# Patient Record
Sex: Female | Born: 1985 | Race: Black or African American | Hispanic: No | Marital: Single | Smoking: Former smoker
Health system: Southern US, Community
[De-identification: ages and names within clinical notes are randomized; demographics above are authoritative.]

## PROBLEM LIST (undated history)

## (undated) DIAGNOSIS — D649 Anemia, unspecified: Secondary | ICD-10-CM

## (undated) DIAGNOSIS — M5430 Sciatica, unspecified side: Secondary | ICD-10-CM

## (undated) HISTORY — PX: APPENDECTOMY: SHX54

## (undated) HISTORY — DX: Morbid (severe) obesity due to excess calories: E66.01

## (undated) HISTORY — DX: Anemia, unspecified: D64.9

---
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
## 2005-11-06 ENCOUNTER — Emergency Department: Payer: Self-pay

## 2005-11-18 ENCOUNTER — Emergency Department: Payer: Self-pay | Admitting: Emergency Medicine

## 2009-05-31 ENCOUNTER — Observation Stay: Payer: Self-pay

## 2009-07-01 ENCOUNTER — Observation Stay: Payer: Self-pay

## 2009-07-02 ENCOUNTER — Ambulatory Visit: Payer: Self-pay | Admitting: Unknown Physician Specialty

## 2009-08-08 ENCOUNTER — Observation Stay: Payer: Self-pay

## 2009-08-11 ENCOUNTER — Inpatient Hospital Stay: Payer: Self-pay

## 2010-07-11 ENCOUNTER — Emergency Department: Payer: Self-pay | Admitting: Emergency Medicine

## 2010-11-04 ENCOUNTER — Emergency Department: Payer: Self-pay | Admitting: Emergency Medicine

## 2011-10-03 ENCOUNTER — Emergency Department: Payer: Self-pay | Admitting: Emergency Medicine

## 2011-10-03 LAB — URINALYSIS, COMPLETE
Bilirubin,UR: NEGATIVE
Glucose,UR: NEGATIVE mg/dL (ref 0–75)
Ketone: NEGATIVE
Nitrite: NEGATIVE
Ph: 6 (ref 4.5–8.0)
Protein: 30
RBC,UR: 38 /HPF (ref 0–5)
Squamous Epithelial: 14
WBC UR: 28 /HPF (ref 0–5)

## 2011-10-03 LAB — HCG, QUANTITATIVE, PREGNANCY: Beta Hcg, Quant.: 22170 m[IU]/mL — ABNORMAL HIGH

## 2011-10-05 ENCOUNTER — Emergency Department: Payer: Self-pay | Admitting: Internal Medicine

## 2011-10-05 LAB — HCG, QUANTITATIVE, PREGNANCY: Beta Hcg, Quant.: 34142 m[IU]/mL — ABNORMAL HIGH

## 2012-02-14 ENCOUNTER — Inpatient Hospital Stay: Payer: Self-pay | Admitting: Surgery

## 2012-02-14 LAB — COMPREHENSIVE METABOLIC PANEL
Alkaline Phosphatase: 91 U/L (ref 50–136)
Anion Gap: 9 (ref 7–16)
BUN: 11 mg/dL (ref 7–18)
Bilirubin,Total: 0.7 mg/dL (ref 0.2–1.0)
Chloride: 104 mmol/L (ref 98–107)
Co2: 23 mmol/L (ref 21–32)
Creatinine: 1.15 mg/dL (ref 0.60–1.30)
EGFR (African American): >60
EGFR (Non-African Amer.): >60
Glucose: 99 mg/dL (ref 65–99)
Potassium: 3.9 mmol/L (ref 3.5–5.1)
SGOT(AST): 12 U/L — ABNORMAL LOW (ref 15–37)

## 2012-02-14 LAB — URINALYSIS, COMPLETE
Glucose,UR: NEGATIVE mg/dL (ref 0–75)
Glucose,UR: 50 mg/dL (ref 0–75)
Leukocyte Esterase: NEGATIVE
Nitrite: NEGATIVE
Ph: 6 (ref 4.5–8.0)
Ph: 5 (ref 4.5–8.0)
Protein: 100
Specific Gravity: 1.026 (ref 1.003–1.030)
Squamous Epithelial: 4
Squamous Epithelial: NONE SEEN
WBC UR: 492 /HPF (ref 0–5)
WBC UR: 4 /HPF (ref 0–5)

## 2012-02-14 LAB — CBC
HCT: 38.1 % (ref 35.0–47.0)
HGB: 12.1 g/dL (ref 12.0–16.0)
MCH: 24.5 pg — ABNORMAL LOW (ref 26.0–34.0)
MCV: 77 fL — ABNORMAL LOW (ref 80–100)
RBC: 4.95 10*6/uL (ref 3.80–5.20)
RDW: 16.8 % — ABNORMAL HIGH (ref 11.5–14.5)
WBC: 22.8 10*3/uL — ABNORMAL HIGH (ref 3.6–11.0)

## 2012-02-14 LAB — CLOSTRIDIUM DIFFICILE BY PCR

## 2012-02-15 LAB — COMPREHENSIVE METABOLIC PANEL
Alkaline Phosphatase: 92 U/L (ref 50–136)
Anion Gap: 11 (ref 7–16)
Bilirubin,Total: 0.4 mg/dL (ref 0.2–1.0)
Calcium, Total: 7.8 mg/dL — ABNORMAL LOW (ref 8.5–10.1)
Chloride: 105 mmol/L (ref 98–107)
EGFR (African American): 59 — ABNORMAL LOW
Glucose: 125 mg/dL — ABNORMAL HIGH (ref 65–99)
Osmolality: 277 (ref 275–301)
Potassium: 4 mmol/L (ref 3.5–5.1)
SGOT(AST): 16 U/L (ref 15–37)
SGPT (ALT): 17 U/L (ref 12–78)
Sodium: 137 mmol/L (ref 136–145)

## 2012-02-15 LAB — CBC WITH DIFFERENTIAL/PLATELET
Basophil %: 0.1 %
Eosinophil %: 0 %
HCT: 36.2 % (ref 35.0–47.0)
Lymphocyte #: 0.5 10*3/uL — ABNORMAL LOW (ref 1.0–3.6)
Lymphocyte %: 1.7 %
MCV: 78 fL — ABNORMAL LOW (ref 80–100)
Monocyte #: 0.6 x10 3/mm (ref 0.2–0.9)
Neutrophil %: 96.4 %
Platelet: 255 10*3/uL (ref 150–440)

## 2012-02-16 LAB — URINE CULTURE

## 2012-02-16 LAB — CBC WITH DIFFERENTIAL/PLATELET
Basophil #: 0 10*3/uL (ref 0.0–0.1)
HGB: 10.3 g/dL — ABNORMAL LOW (ref 12.0–16.0)
Lymphocyte %: 8.8 %
MCH: 24.4 pg — ABNORMAL LOW (ref 26.0–34.0)
Monocyte %: 5.3 %
RDW: 17 % — ABNORMAL HIGH (ref 11.5–14.5)
WBC: 17 10*3/uL — ABNORMAL HIGH (ref 3.6–11.0)

## 2012-02-16 LAB — BASIC METABOLIC PANEL WITH GFR
Anion Gap: 7
BUN: 13 mg/dL
Calcium, Total: 8.3 mg/dL — ABNORMAL LOW
Chloride: 109 mmol/L — ABNORMAL HIGH
Co2: 24 mmol/L
Creatinine: 1.12 mg/dL
EGFR (African American): >60
EGFR (Non-African Amer.): >60
Glucose: 99 mg/dL
Osmolality: 280
Potassium: 4 mmol/L
Sodium: 140 mmol/L

## 2012-02-16 LAB — PATHOLOGY REPORT

## 2012-02-16 LAB — STOOL CULTURE

## 2012-02-17 LAB — CBC WITH DIFFERENTIAL/PLATELET
Basophil #: 0.1 10*3/uL (ref 0.0–0.1)
Eosinophil #: 0.2 10*3/uL (ref 0.0–0.7)
Lymphocyte #: 1.8 10*3/uL (ref 1.0–3.6)
Lymphocyte %: 15.1 %
MCH: 25.5 pg — ABNORMAL LOW (ref 26.0–34.0)
MCV: 77 fL — ABNORMAL LOW (ref 80–100)
Monocyte #: 0.9 x10 3/mm (ref 0.2–0.9)
Monocyte %: 7.4 %
Neutrophil #: 9.2 10*3/uL — ABNORMAL HIGH (ref 1.4–6.5)
Platelet: 240 10*3/uL (ref 150–440)
RBC: 4.1 10*6/uL (ref 3.80–5.20)
RDW: 16.9 % — ABNORMAL HIGH (ref 11.5–14.5)

## 2012-02-18 LAB — URINALYSIS, COMPLETE
Bacteria: NONE SEEN
Bilirubin,UR: NEGATIVE
Blood: NEGATIVE
Glucose,UR: NEGATIVE mg/dL (ref 0–75)
Ketone: NEGATIVE
Leukocyte Esterase: NEGATIVE
Ph: 6 (ref 4.5–8.0)
Squamous Epithelial: 5
WBC UR: 7 /HPF (ref 0–5)

## 2012-02-19 LAB — CULTURE, BLOOD (SINGLE)

## 2012-02-20 LAB — URINE CULTURE

## 2012-02-21 LAB — CREATININE, SERUM
EGFR (African American): >60
EGFR (Non-African Amer.): >60

## 2012-08-19 ENCOUNTER — Emergency Department: Payer: Self-pay | Admitting: Emergency Medicine

## 2012-10-21 ENCOUNTER — Emergency Department: Payer: Self-pay | Admitting: Emergency Medicine

## 2013-07-13 ENCOUNTER — Emergency Department: Payer: Self-pay | Admitting: Emergency Medicine

## 2014-03-10 DIAGNOSIS — Z720 Tobacco use: Secondary | ICD-10-CM | POA: Insufficient documentation

## 2014-03-10 DIAGNOSIS — Z6841 Body Mass Index (BMI) 40.0 and over, adult: Secondary | ICD-10-CM | POA: Insufficient documentation

## 2014-05-29 NOTE — Discharge Summary (Signed)
PATIENT NAME:  Ashley Olson, Ashley Olson MR#:  [id number] DATE OF BIRTH:  07-24-1985  DATE OF ADMISSION:  02/14/2012 DATE OF DISCHARGE:  02/22/2012  PRINCIPLE DIAGNOSIS: Generalized peritonitis likely secondary to pelvic inflammatory disease.   OTHER DIAGNOSIS: Morbid obesity.   PRINCIPLE PROCEDURE PERFORMED DURING THIS ADMISSION: 02/14/2012 exploratory laparotomy with drainage and washout of generalized peritonitis, incidental appendectomy.   HOSPITAL COURSE: Ms. Ashley Olson was admitted to the hospital, given broad-spectrum antibiotics, and underwent the above-mentioned procedure for the above-mentioned diagnosis. Postoperatively, she had doxycycline added and she had slowly progressive improvement until the day of discharge, on January 16th, where she was tolerating a regular diet, was ambulatory and was discharged home on oral morphine and Pepcid and a regular diet. She was asked to make an appointment to see me in 1 week for followup and staple removal and to call in the interim for any problems.  ____________________________ Claude MangesWilliam F. Sebastin Perlmutter, MD wfm:sb Olson: 03/06/2012 20:55:34 ET T: 03/07/2012 08:56:19 ET JOB#: [id number]  cc: Claude MangesWilliam F. Tamana Hatfield, MD, <Dictator> Claude MangesWILLIAM F Jheremy Boger MD ELECTRONICALLY SIGNED 03/07/2012 19:10

## 2014-05-29 NOTE — H&P (Signed)
History of Present Illness 29 yo [place] bf with 2 - 3 days of abdominal pain that began periumbilical and spread out bilaterally. Pain is not continuous; it is intermittent. She had some nausea yesterday and has been anorexic for 2 - 3 days; no vomiting. She was feverish yesterday but didn't take her temp. When the pain began she experienced severe - almost watery - frequent diarrhea. Prior to her recent illness she denies eating fish or chicken bones or anything with bay leaves in it.    Past History MO   ALLERGIES:  Amoxicillin: Swelling  Family and Social History:   Family History Non-Contributory    Social History negative tobacco, positive ETOH, single, two kids, works in [employer] [place]   Review of Systems:   Fever/Chills Yes    Cough No    Sputum No    Abdominal Pain Yes    Diarrhea Yes    Constipation No    Nausea/Vomiting Yes    SOB/DOE No    Chest Pain No    Dysuria No    Tolerating PT No  abdomen hurts when she walks    Tolerating Diet No  Nauseated    Medications/Allergies Reviewed Medications/Allergies reviewed   Physical Exam:   GEN well developed, well nourished, obese    HEENT pink conjunctivae, PERRL, hearing intact to voice, dry oral mucosa, Oropharynx clear    NECK supple  thyroid not tender    RESP normal resp effort  clear BS  no use of accessory muscles    CARD regular rate  no murmur  no JVD  no Rub    ABD positive tenderness  very obese, severe equisite diffuse tenderness with voluntary AND involutary guarding    LYMPH negative neck    EXTR negative cyanosis/clubbing, negative edema    SKIN skin turgor good    NEURO cranial nerves intact, follows commands, motor/sensory function intact    PSYCH alert, A+O to time, place, person   Lab Results:  Hepatic:  08-Jan-14 12:52    Bilirubin, Total 0.7   Alkaline Phosphatase 91   SGPT (ALT) 17   SGOT (AST)  12   Total Protein, Serum  8.5   Albumin, Serum   3.2  Routine Micro:  08-Jan-14 14:01    Micro Text Report WET PREP   COMMENT                   NO WHITE BLOOD CELLS SEEN   COMMENT                   NO TRICHOMONAS,SPERMATOZOA,YEAST,OR CLUE CELLS SEEN   ANTIBIOTIC                        Comment 1. NO WHITE BLOOD CELLS SEEN   Comment 2. NO TRICHOMONAS,SPERMATOZOA,YEAST,OR CLUE CELLS SEEN  Result(s) reported on 14 Feb 2012 at 02:21PM.    15:25    Micro Text Report CLOS.DIFF ASSAY, RT-PCR   COMMENT                   NEGATIVE-CLOS.DIFFICILE TOXIN NOT DETECTED BY PCR   ANTIBIOTIC                        Clostridium Diff Toxin by RT-PCR NEGATIVE-CLOS.DIFFICILE TOXIN NOT DETECTED BY PCR ---------------------------------- Test procedure integrates sample purification, nucleic acid amplification, and detection of the target Clostridium  difficile sequence in simple or complex samplesusing real-time PCR and RT-PCR assays.  Routine Chem:  08-Jan-14 12:52    Glucose, Serum 99   BUN 11   Creatinine (comp) 1.15   Sodium, Serum 136   Potassium, Serum 3.9   Chloride, Serum 104   CO2, Serum 23   Calcium (Total), Serum 9.2   Osmolality (calc) 271   eGFR (African American) >60   eGFR (Non-African American) >60 (eGFR values <74m/min/1.73 m2 may be an indication of chronic kidney disease (CKD). Calculated eGFR is useful in patients with stable renal function. The eGFR calculation will not be reliable in acutely ill patients when serum creatinine is changing rapidly. It is not useful in  patients on dialysis. The eGFR calculation may not be applicable to patients at the low and high extremes of body sizes, pregnant women, and vegetarians.)   Anion Gap 9   Lipase 117 (Result(s) reported on 14 Feb 2012 at 01:30PM.)  Routine UA:  08-Jan-14 12:42    Color (UA) Amber   Clarity (UA) Turbid   Glucose (UA) Negative   Bilirubin (UA) Negative   Ketones (UA) 1+   Specific Gravity (UA) 1.026   Blood (UA) 3+   pH (UA) 6.0   Protein (UA) 100 mg/dL    Nitrite (UA) Negative   Leukocyte Esterase (UA) 3+ (Result(s) reported on 14 Feb 2012 at 01:32PM.)   RBC (UA) 283 /HPF   WBC (UA) 492 /HPF   Bacteria (UA) 2+   Epithelial Cells (UA) 4 /HPF   WBC Clump (UA) PRESENT   Mucous (UA) PRESENT (Result(s) reported on 14 Feb 2012 at 01:32PM.)  Routine Hem:  08-Jan-14 12:52    WBC (CBC)  22.8   RBC (CBC) 4.95   Hemoglobin (CBC) 12.1   Hematocrit (CBC) 38.1   Platelet Count (CBC) 285 (Result(s) reported on 14 Feb 2012 at 01:31PM.)   MCV  77   MCH  24.5   MCHC  31.8   RDW  16.8     Assessment/Admission Diagnosis Perforated hepatic flexure of unknown etiology Severe UTI    Plan Resuscitate w/ IVF IV ABx Exploratory Laparotomy   Electronic Signatures: MConsuela Mimes(MD)  (Signed 08-Jan-14 21:01)  Authored: CHIEF COMPLAINT and HISTORY, PAST MEDICAL/SURGIAL HISTORY, ALLERGIES, HOME MEDICATIONS, FAMILY AND SOCIAL HISTORY, REVIEW OF SYSTEMS, PHYSICAL EXAM, LABS, ASSESSMENT AND PLAN   Last Updated: 08-Jan-14 21:01 by MConsuela Mimes(MD)

## 2014-05-29 NOTE — Op Note (Signed)
PATIENT NAME:  Ashley Olson, Ashley D MR#:  [id number] DATE OF BIRTH:  [birth year]  DATE OF PROCEDURE:  02/14/2012  PROCEDURES PERFORMED:  1.  Exploratory laparotomy.  2.  Incidental appendectomy.   PREOPERATIVE DIAGNOSIS: Pneumoperitoneum secondary to hepatic flexure colon perforation.   POSTOPERATIVE DIAGNOSIS: Generalized peritonitis likely secondary to pelvic inflammatory disease; no bowel lesions appreciated.   SURGEON: Claude MangesWilliam F Anaaya Fuster, M.D.   ANESTHESIA: General.   PROCEDURE IN DETAIL: The patient was placed supine on the operating room table and prepped and draped in the usual sterile fashion. A midline incision was made in the upper abdomen and this was eventually extended slightly below the umbilicus and the subcutaneous tissue and linea alba were opened with the electrocautery and the peritoneum was entered carefully. There was generalized peritonitis with pus in all 4 quadrants of the abdomen. Careful inspection of the hepatic flexure area of the colon near the gallbladder where the supposed area of the perforation had occurred, showed the colon wall to be completely intact. Furthermore, there was no evidence of duodenal ulcer, perforation and the gallbladder appeared completely normal. Further exploration failed to reveal any colon perforations into either the ascending colon, the transverse colon, the descending colon or the sigmoid colon. After the incision was extended distally, I could see that both fallopian tubes were injected and red, erythematous and edematous, consistent with pelvic inflammatory disease. 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 Both ovaries appeared relatively normal with the exception of surrounding exudative inflammation. The appendix itself was quite large and there was exudate along the serosal surface of the appendix, but I did not believe that the patient had appendicitis as the etiology for her generalized peritonitis.   Nevertheless, I performed an  incidental appendectomy by taking down the mesoappendix with the Harmonic scalpel and amputating the appendix within a GIA stapling device just at its base and junction with the cecum. I then washed out the entire abdomen with copious amounts of warm normal saline and removed as much exudative process from the serosal surface of various small intestinal loops as I could. The entire small intestine was run from one end to the other and this was entirely normal. The rectum and rectosigmoid junction were better visualized after the incision had been extended inferiorly and this also was normal. Although the cecum was dilated as was the transverse colon, they were also normal. I draped the omentum over top of the small intestine, which was replaced in its anatomic position and closed the linea alba with running #1 PDS suture and placed 2 interrupted internal simple retention sutures of #2 Prolene. I irrigated the subcutaneous tissue and closed the skin with a skin stapling device and added a sterile dressing to complete the procedure. The patient tolerated the procedure well. There were no complications.   ____________________________ Claude MangesWilliam F. Lawarence Meek, MD wfm:aw D: 02/14/2012 23:59:06 ET T: 02/15/2012 08:38:51 ET JOB#: [id number]  cc: Claude MangesWilliam F. Kahlia Lagunes, MD, <Dictator> Claude MangesWILLIAM F Ersilia Brawley MD ELECTRONICALLY SIGNED 02/15/2012 19:42

## 2014-07-07 IMAGING — CR DG LUMBAR SPINE 2-3V
1 series · 3 of 3 positions shown · non-contrast
Comparison: CT abdomen 02/14/2012.

CLINICAL DATA: No injury.  Low back pain radiating down right leg.

EXAM:
LUMBAR SPINE - 2-3 VIEW

[Series 1: t lumbar spine ap · 0.14mm/px · 3 of 3 slices shown]
[im 1/3]
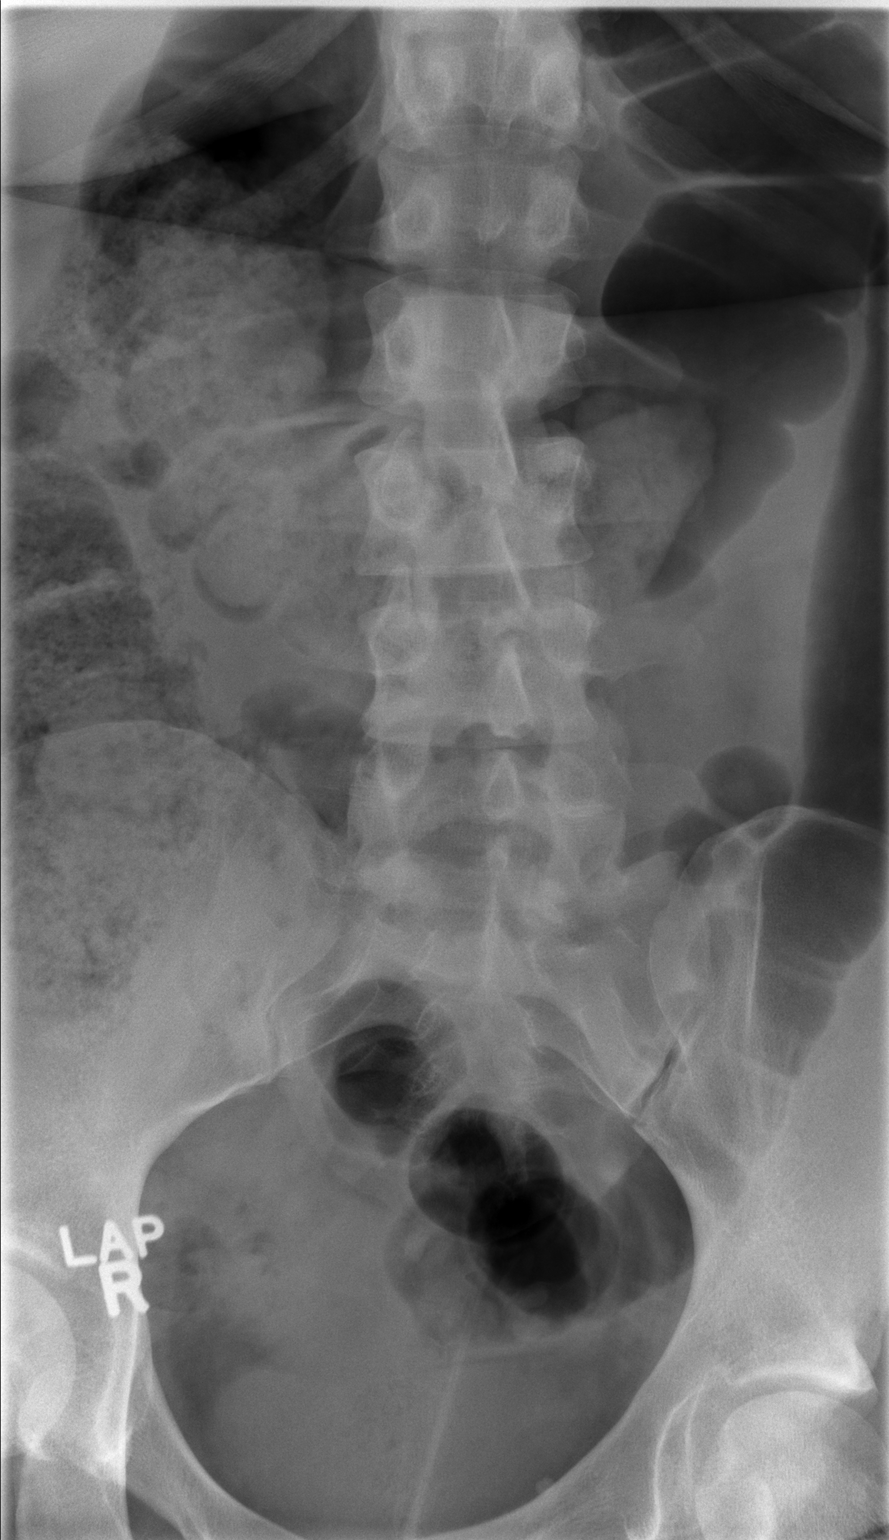
[im 2/3]
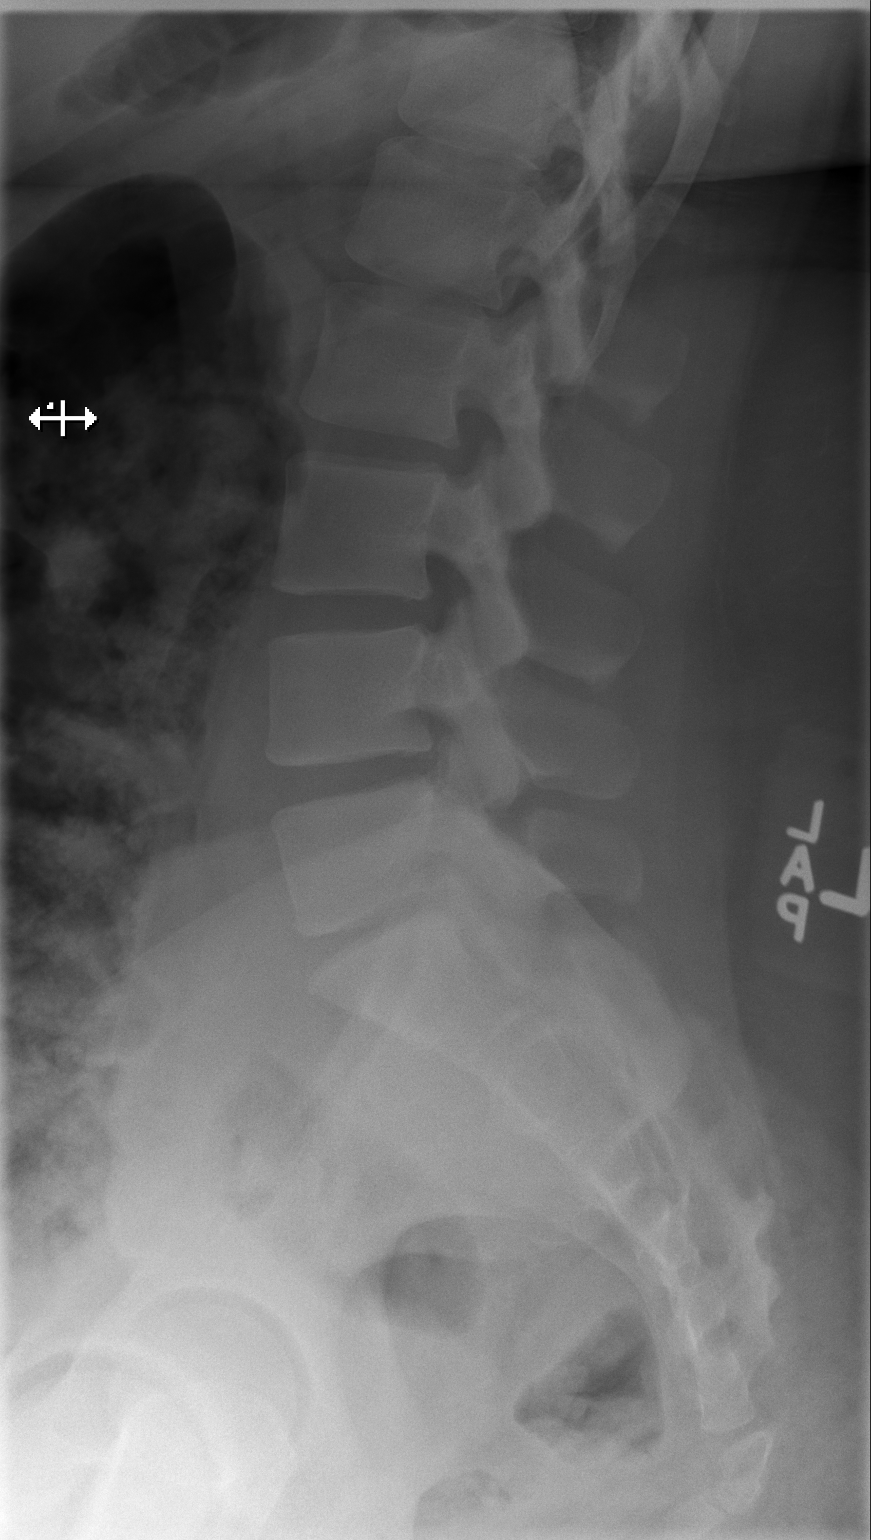
[im 3/3]
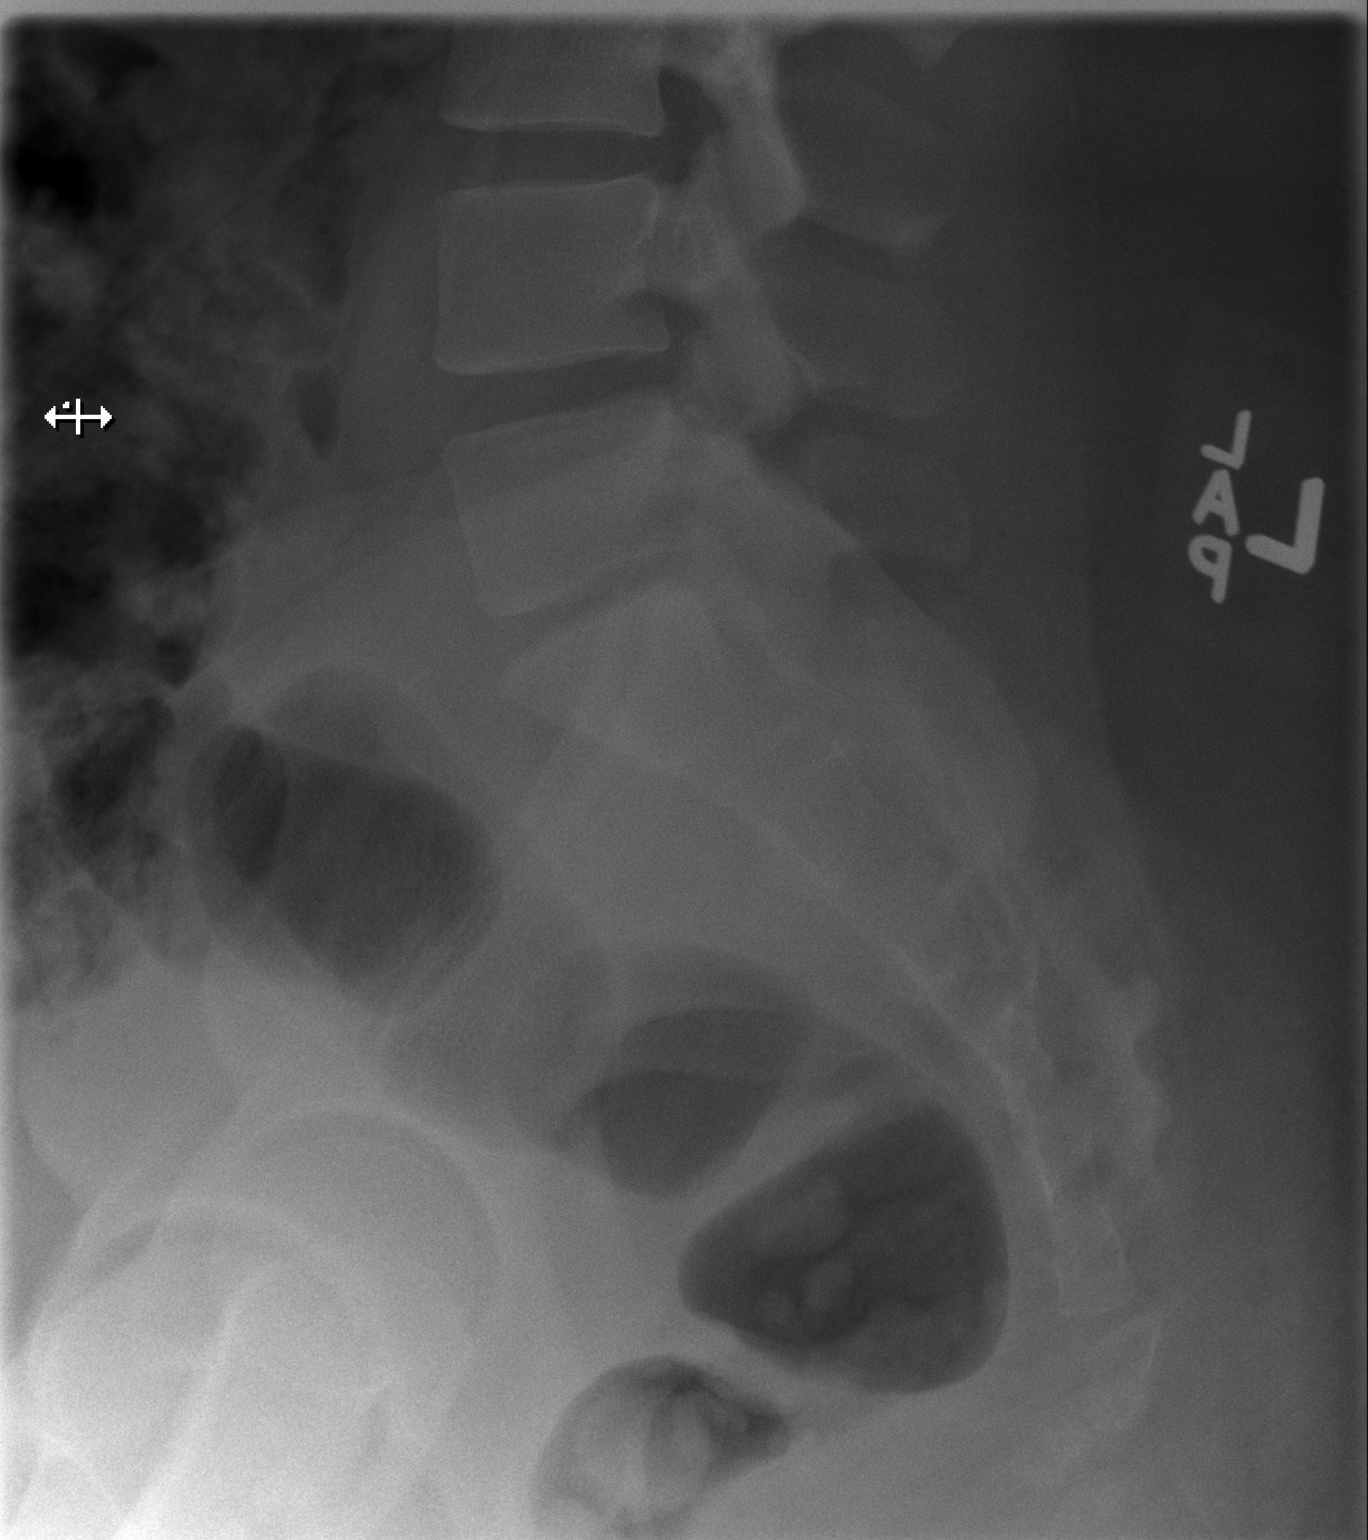

[3 of 3 positions shown; findings below may reference images not displayed]

FINDINGS: Vertebral body alignment and heights are within normal. There is
minimal disc space narrowing at the L4-5 and L5-S1 levels. There is
no compression fracture or subluxation. Mild fecal retention over
the right colon and transverse colon.
IMPRESSION: Evidence of early degenerative disc disease at the L4-5 and L5-S1
levels.

## 2014-09-08 ENCOUNTER — Emergency Department
Admission: EM | Admit: 2014-09-08 | Discharge: 2014-09-08 | Disposition: A | Discharge status: Home / Self Care | Payer: Managed Care, Other (non HMO) | Attending: Emergency Medicine | Admitting: Emergency Medicine

## 2014-09-08 ENCOUNTER — Encounter: Payer: Self-pay | Admitting: Emergency Medicine

## 2014-09-08 DIAGNOSIS — S39012A Strain of muscle, fascia and tendon of lower back, initial encounter: Secondary | ICD-10-CM | POA: Diagnosis not present

## 2014-09-08 DIAGNOSIS — Z880 Allergy status to penicillin: Secondary | ICD-10-CM | POA: Diagnosis not present

## 2014-09-08 DIAGNOSIS — S3992XA Unspecified injury of lower back, initial encounter: Secondary | ICD-10-CM | POA: Diagnosis present

## 2014-09-08 DIAGNOSIS — Z720 Tobacco use: Secondary | ICD-10-CM | POA: Insufficient documentation

## 2014-09-08 DIAGNOSIS — Y9289 Other specified places as the place of occurrence of the external cause: Secondary | ICD-10-CM | POA: Insufficient documentation

## 2014-09-08 DIAGNOSIS — Y990 Civilian activity done for income or pay: Secondary | ICD-10-CM | POA: Insufficient documentation

## 2014-09-08 DIAGNOSIS — Y9389 Activity, other specified: Secondary | ICD-10-CM | POA: Diagnosis not present

## 2014-09-08 DIAGNOSIS — X58XXXA Exposure to other specified factors, initial encounter: Secondary | ICD-10-CM | POA: Insufficient documentation

## 2014-09-08 MED ORDER — KETOROLAC TROMETHAMINE 10 MG PO TABS: 10.0000 mg | ORAL_TABLET | Freq: Four times a day (QID) | ORAL | Status: DC | PRN

## 2014-09-08 MED ORDER — KETOROLAC TROMETHAMINE 60 MG/2ML IM SOLN
60.0000 mg | Freq: Once | INTRAMUSCULAR | Status: AC
  Administered 2014-09-08: 60 mg via INTRAMUSCULAR
  Filled 2014-09-08: qty 2

## 2014-09-08 MED ORDER — CYCLOBENZAPRINE HCL 10 MG PO TABS: 10.0000 mg | ORAL_TABLET | Freq: Three times a day (TID) | ORAL | Status: DC | PRN

## 2014-09-08 MED ORDER — HYDROCODONE-ACETAMINOPHEN 5-325 MG PO TABS
1.0000 | ORAL_TABLET | Freq: Once | ORAL | Status: AC
  Administered 2014-09-08: 1 via ORAL
  Filled 2014-09-08: qty 1

## 2014-09-08 NOTE — ED Provider Notes (Signed)
[facility] Emergency Department Provider Note ____________________________________________  Time seen: Approximately 8:26 PM  I have reviewed the triage vital signs and the nursing notes.   HISTORY  Chief Complaint Back Pain   HPI Ashley Olson is a 29 y.o. female who presents to the emergency department for evaluation of left lumbar pain. She denies injury, but may have strained her back yesterday while at work while lifting some boxes. She has not taken anything at home for pain. She has had sciatic pain on the left side but this is different.  History reviewed. No pertinent past medical history.  There are no active problems to display for this patient.   Past Surgical History  Procedure Laterality Date  . Appendectomy      Current Outpatient Rx  Name  Route  Sig  Dispense  Refill  . cyclobenzaprine (FLEXERIL) 10 MG tablet   Oral   Take 1 tablet (10 mg total) by mouth 3 (three) times daily as needed for muscle spasms.   30 tablet   0   . ketorolac (TORADOL) 10 MG tablet   Oral   Take 1 tablet (10 mg total) by mouth every 6 (six) hours as needed.   20 tablet   0     Allergies Amoxicillin  No family history on file.  Social History History  Substance Use Topics  . Smoking status: Current Some Day Smoker  . Smokeless tobacco: Not on file  . Alcohol Use: No    Review of Systems Constitutional: No recent illness. Eyes: No visual changes. ENT: No sore throat. Cardiovascular: Denies chest pain or palpitations. Respiratory: Denies shortness of breath. Gastrointestinal: No abdominal pain.  Genitourinary: Negative for dysuria. Musculoskeletal: Pain in left lower back. Skin: Negative for rash. Neurological: Negative for headaches, focal weakness or numbness. 10-point ROS otherwise negative.  ____________________________________________   PHYSICAL EXAM:  VITAL SIGNS: ED Triage Vitals  Enc Vitals Group     BP 09/08/14 1926  161/100 mmHg     Pulse Rate 09/08/14 1926 60     Resp 09/08/14 1926 20     Temp 09/08/14 1926 97.6 F (36.4 C)     Temp Source 09/08/14 1926 Oral     SpO2 09/08/14 1926 99 %     Weight 09/08/14 1926 385 lb (174.635 kg)     Height 09/08/14 1926 6' (1.829 m)     Head Cir --      Peak Flow --      Pain Score 09/08/14 1925 10     Pain Loc --      Pain Edu? --      Excl. in GC? --     Constitutional: Alert and oriented. Well appearing and in no acute distress. Eyes: Conjunctivae are normal. EOMI. Head: Atraumatic. Nose: No congestion/rhinnorhea. Neck: No stridor.  Respiratory: Normal respiratory effort.   Musculoskeletal: Left lower lumbar spine paraspinal muscles tenderness to palpation without focal tenderness in the midline. Neurologic:  Normal speech and language. No gross focal neurologic deficits are appreciated. Speech is normal. No gait instability. Skin:  Skin is warm, dry and intact. Atraumatic. Psychiatric: Mood and affect are normal. Speech and behavior are normal.  ____________________________________________   LABS (all labs ordered are listed, but only abnormal results are displayed)  Labs Reviewed - No data to display ____________________________________________  RADIOLOGY  Not indicated ____________________________________________   PROCEDURES  Procedure(s) performed: None   ____________________________________________   INITIAL IMPRESSION / ASSESSMENT AND PLAN / ED COURSE  Pertinent labs & imaging results that were available during my care of the patient were reviewed by me and considered in my medical decision making (see chart for details).  Patient was advised to follow-up with orthopedics for symptoms that are not improving over the next few days. She was advised to take the anti-inflammatory as prescribed. She was advised to take the muscle relaxer as needed as prescribed. She was advised to return to the emergency department for symptoms that  change or worsen if she is unable schedule an appointment with orthopedics. ____________________________________________   FINAL CLINICAL IMPRESSION(S) / ED DIAGNOSES  Final diagnoses:  Back strain, initial encounter      Chinita Pester, FNP 09/08/14 2201  Minna Antis, MD 09/08/14 2229

## 2014-09-08 NOTE — ED Notes (Signed)
Patient ambulatory to triage with steady gait, without difficulty or distress noted; pt reports burning  lower back pain, hx of same, but increased after lifting boxes yesterday

## 2014-09-08 NOTE — Discharge Instructions (Signed)
Follow up with the orthopedic doctor for symptoms that are not improving over the next few days. Return to the emergency department for symptoms that change or worsen if unable to schedule an appointment.

## 2014-11-04 ENCOUNTER — Emergency Department
Admission: EM | Admit: 2014-11-04 | Discharge: 2014-11-04 | Disposition: A | Discharge status: Home / Self Care | Payer: Managed Care, Other (non HMO) | Attending: Emergency Medicine | Admitting: Emergency Medicine

## 2014-11-04 ENCOUNTER — Emergency Department: Payer: Managed Care, Other (non HMO)

## 2014-11-04 DIAGNOSIS — R202 Paresthesia of skin: Secondary | ICD-10-CM | POA: Insufficient documentation

## 2014-11-04 DIAGNOSIS — Z880 Allergy status to penicillin: Secondary | ICD-10-CM | POA: Insufficient documentation

## 2014-11-04 DIAGNOSIS — R200 Anesthesia of skin: Secondary | ICD-10-CM | POA: Diagnosis present

## 2014-11-04 DIAGNOSIS — M25511 Pain in right shoulder: Secondary | ICD-10-CM | POA: Diagnosis not present

## 2014-11-04 DIAGNOSIS — Z720 Tobacco use: Secondary | ICD-10-CM | POA: Insufficient documentation

## 2014-11-04 DIAGNOSIS — R091 Pleurisy: Secondary | ICD-10-CM | POA: Diagnosis not present

## 2014-11-04 LAB — BASIC METABOLIC PANEL
Anion gap: 7 (ref 5–15)
BUN: 15 mg/dL (ref 6–20)
CALCIUM: 9 mg/dL (ref 8.9–10.3)
CO2: 26 mmol/L (ref 22–32)
CREATININE: 0.89 mg/dL (ref 0.44–1.00)
Chloride: 109 mmol/L (ref 101–111)
GFR calc Af Amer: >60 mL/min (ref >60)
GFR calc non Af Amer: >60 mL/min (ref >60)
GLUCOSE: 98 mg/dL (ref 65–99)
Potassium: 4.2 mmol/L (ref 3.5–5.1)
Sodium: 142 mmol/L (ref 135–145)

## 2014-11-04 LAB — CBC
HCT: 36.1 % (ref 35.0–47.0)
Hemoglobin: 11.8 g/dL — ABNORMAL LOW (ref 12.0–16.0)
MCH: 25.6 pg — AB (ref 26.0–34.0)
MCHC: 32.8 g/dL (ref 32.0–36.0)
MCV: 78 fL — ABNORMAL LOW (ref 80.0–100.0)
PLATELETS: 289 10*3/uL (ref 150–440)
RBC: 4.63 MIL/uL (ref 3.80–5.20)
RDW: 17 % — ABNORMAL HIGH (ref 11.5–14.5)
WBC: 6.7 10*3/uL (ref 3.6–11.0)

## 2014-11-04 MED ORDER — OXYCODONE-ACETAMINOPHEN 5-325 MG PO TABS
2.0000 | ORAL_TABLET | Freq: Once | ORAL | Status: AC
  Administered 2014-11-04: 2 via ORAL
  Filled 2014-11-04: qty 2

## 2014-11-04 MED ORDER — IBUPROFEN 800 MG PO TABS: 800.0000 mg | ORAL_TABLET | Freq: Three times a day (TID) | ORAL | Status: DC | PRN

## 2014-11-04 MED ORDER — DIAZEPAM 5 MG PO TABS: 5.0000 mg | ORAL_TABLET | Freq: Three times a day (TID) | ORAL | Status: DC | PRN

## 2014-11-04 MED ORDER — PREDNISONE 20 MG PO TABS
60.0000 mg | ORAL_TABLET | Freq: Once | ORAL | Status: AC
  Administered 2014-11-04: 60 mg via ORAL
  Filled 2014-11-04: qty 3

## 2014-11-04 NOTE — ED Provider Notes (Signed)
[facility] Emergency Department Provider Note     Time seen: ----------------------------------------- 2:34 PM on 11/04/2014 -----------------------------------------    I have reviewed the triage vital signs and the nursing notes.   HISTORY  Chief Complaint Numbness and Pleurisy    HPI Ashley Olson is a 29 y.o. female since ER with right shoulder and arm pain. Patient also reports pins and needle sensation along the right arm and shoulder. Pain is worsened with range of motion right shoulder. She's not had any recent fall or trauma to the upper extremity. Patient states she woke up like this, pain is described as 9 out of 10. Patient states she works in [employer], again has not had any recent injury.   No past medical history on file.  There are no active problems to display for this patient.   Past Surgical History  Procedure Laterality Date  . Appendectomy      Allergies Amoxicillin  Social History Social History  Substance Use Topics  . Smoking status: Current Some Day Smoker  . Smokeless tobacco: Not on file  . Alcohol Use: No    Review of Systems Constitutional: Negative for fever. Eyes: Negative for visual changes. ENT: Negative for sore throat. Cardiovascular: Negative for chest pain. Respiratory: Negative for shortness of breath. Gastrointestinal: Negative for abdominal pain, vomiting and diarrhea. Genitourinary: Negative for dysuria. Musculoskeletal: Positive for right upper extremity pain Skin: Negative for rash. Neurological: Negative for headaches, positive for paresthesias in the right upper extremity  10-point ROS otherwise negative.  ____________________________________________   PHYSICAL EXAM:  VITAL SIGNS: ED Triage Vitals  Enc Vitals Group     BP 11/04/14 1027 136/82 mmHg     Pulse Rate 11/04/14 1027 93     Resp 11/04/14 1027 18     Temp 11/04/14 1027 98.6 F (37 C)     Temp Source 11/04/14  1027 Oral     SpO2 11/04/14 1027 99 %     Weight 11/04/14 1027 385 lb (174.635 kg)     Height 11/04/14 1027  (1.803 m)     Head Cir --      Peak Flow --      Pain Score 11/04/14 1100 8     Pain Loc --      Pain Edu? --      Excl. in GC? --     Constitutional: Alert and oriented. Well appearing and in no distress. Eyes: Conjunctivae are normal. PERRL. Normal extraocular movements. ENT   Head: Normocephalic and atraumatic.   Nose: No congestion/rhinnorhea.   Mouth/Throat: Mucous membranes are moist.   Neck: No stridor. Cardiovascular: Normal rate, regular rhythm. Normal and symmetric distal pulses are present in all extremities. No murmurs, rubs, or gallops. Respiratory: Normal respiratory effort without tachypnea nor retractions. Breath sounds are clear and equal bilaterally. No wheezes/rales/rhonchi. Gastrointestinal: Soft and nontender. No distention. No abdominal bruits.  Musculoskeletal: Pain is elicited with range of motion right shoulder, mild crepitus range of motion right shoulder.  Neurologic:  Normal speech and language. No gross focal neurologic deficits are appreciated. Speech is normal. No gait instability. Patient describes paresthesias in the right arm that are nonanatomic. Skin:  Skin is warm, dry and intact. No rash noted. Psychiatric: Mood and affect are normal. Speech and behavior are normal. Patient exhibits appropriate insight and judgment. ____________________________________________  EKG: Interpreted by me. Normal sinus rhythm with normal axis normal intervals. No evidence of acute infarction. Rate is 86 bpm  ____________________________________________  ED COURSE:  Pertinent labs & imaging results that were available during my care of the patient were reviewed by me and considered in my medical decision making (see chart for details). She is no acute distress, likely shoulder pain from recent injury or cervical  radiculopathy. ____________________________________________    LABS (pertinent positives/negatives)  Labs Reviewed  CBC - Abnormal; Notable for the following:    Hemoglobin 11.8 (*)    MCV 78.0 (*)    MCH 25.6 (*)    RDW 17.0 (*)    All other components within normal limits  BASIC METABOLIC PANEL    RADIOLOGY Images were viewed by me  Shoulder x-ray is unremarkable ____________________________________________  FINAL ASSESSMENT AND PLAN  Shoulder pain, paresthesias  Plan: Patient with labs and imaging as dictated above. Patient's x-rays looked good here, no signs of osteoarthritis. This is likely a combination of musculoskeletal pain and paresthesias from muscle spasm. She'll be given a medication and muscle relaxants. She is [name] follow-up with her doctor.   [name], MD   [name], MD 11/04/14 [phone]

## 2014-11-04 NOTE — ED Notes (Addendum)
Pt presents to ER from home via POV with localized right arm numbness and tingling feeling in chest with black floaty spots since this morning. Pt states "I woke up like this". No facial weakness or ipsilateral weakness present. Pain 9/10.

## 2014-11-04 NOTE — Discharge Instructions (Signed)
Arthralgia °Your caregiver has diagnosed you as suffering from an arthralgia. Arthralgia means there is pain in a joint. This can come from many reasons including: °· Bruising the joint which causes soreness (inflammation) in the joint. °· Wear and tear on the joints which occur as we grow older (osteoarthritis). °· Overusing the joint. °· Various forms of arthritis. °· Infections of the joint. °Regardless of the cause of pain in your joint, most of these different pains respond to anti-inflammatory drugs and rest. The exception to this is when a joint is infected, and these cases are treated with antibiotics, if it is a bacterial infection. °HOME CARE INSTRUCTIONS  °· Rest the injured area for as long as directed by your caregiver. Then slowly start using the joint as directed by your caregiver and as the pain allows. Crutches as directed may be useful if the ankles, knees or hips are involved. If the knee was splinted or casted, continue use and care as directed. If an stretchy or elastic wrapping bandage has been applied today, it should be removed and re-applied every 3 to 4 hours. It should not be applied tightly, but firmly enough to keep swelling down. Watch toes and feet for swelling, bluish discoloration, coldness, numbness or excessive pain. If any of these problems (symptoms) occur, remove the ace bandage and re-apply more loosely. If these symptoms persist, contact your caregiver or return to this location. °· For the first 24 hours, keep the injured extremity elevated on pillows while lying down. °· Apply ice for 15-20 minutes to the sore joint every couple hours while awake for the first half day. Then 03-04 times per day for the first 48 hours. Put the ice in a plastic bag and place a towel between the bag of ice and your skin. °· Wear any splinting, casting, elastic bandage applications, or slings as instructed. °· Only take over-the-counter or prescription medicines for pain, discomfort, or fever as  directed by your caregiver. Do not use aspirin immediately after the injury unless instructed by your physician. Aspirin can cause increased bleeding and bruising of the tissues. °· If you were given crutches, continue to use them as instructed and do not resume weight bearing on the sore joint until instructed. °Persistent pain and inability to use the sore joint as directed for more than 2 to 3 days are warning signs indicating that you should see a caregiver for a follow-up visit as soon as possible. Initially, a hairline fracture (break in bone) may not be evident on X-rays. Persistent pain and swelling indicate that further evaluation, non-weight bearing or use of the joint (use of crutches or slings as instructed), or further X-rays are indicated. X-rays may sometimes not show a small fracture until a week or 10 days later. Make a follow-up appointment with your own caregiver or one to whom we have referred you. A radiologist (specialist in reading X-rays) may read your X-rays. Make sure you know how you are to obtain your X-ray results. Do not assume everything is normal if you do not hear from us. °SEEK MEDICAL CARE IF: °Bruising, swelling, or pain increases. °SEEK IMMEDIATE MEDICAL CARE IF:  °· Your fingers or toes are numb or blue. °· The pain is not responding to medications and continues to stay the same or get worse. °· The pain in your joint becomes severe. °· You develop a fever over 102° F (38.9° C). °· It becomes impossible to move or use the joint. °MAKE SURE YOU:  °·   Understand these instructions. °· Will watch your condition. °· Will get help right away if you are not doing well or get worse. °Document Released: 01/23/2005 Document Revised: 04/17/2011 Document Reviewed: 09/11/2007 °[employer]® Patient Information ©2015 [employer]. This information is not intended to replace advice given to you by your health care provider. Make sure you discuss any questions you have with your health care  provider. ° °Paresthesia °Paresthesia is an abnormal burning or prickling sensation. This sensation is generally felt in the hands, arms, legs, or feet. However, it may occur in any part of the body. It is usually not painful. The feeling may be described as: °· Tingling or numbness. °· "Pins and needles." °· Skin crawling. °· Buzzing. °· Limbs "falling asleep." °· Itching. °Most people experience temporary (transient) paresthesia at some time in their lives. °CAUSES  °Paresthesia may occur when you breathe too quickly (hyperventilation). It can also occur without any apparent cause. Commonly, paresthesia occurs when pressure is placed on a nerve. The feeling quickly goes away once the pressure is removed. For some people, however, paresthesia is a long-lasting (chronic) condition caused by an underlying disorder. The underlying disorder may be: °· A traumatic, direct injury to nerves. Examples include a: °¨ Broken (fractured) neck. °¨ Fractured skull. °· A disorder affecting the brain and spinal cord (central nervous system). Examples include: °¨ Transverse myelitis. °¨ Encephalitis. °¨ Transient ischemic attack. °¨ Multiple sclerosis. °¨ Stroke. °¨ Tumor or blood vessel problems, such as an arteriovenous malformation pressing against the brain or spinal cord. °· A condition that damages the peripheral nerves (peripheral neuropathy). Peripheral nerves are not part of the brain and spinal cord. These conditions include: °¨ Diabetes. °¨ Peripheral vascular disease. °¨ Nerve entrapment syndromes, such as carpal tunnel syndrome. °¨ Shingles. °¨ Hypothyroidism. °¨ Vitamin B12 deficiencies. °¨ Alcoholism. °¨ Heavy metal poisoning (lead, arsenic). °¨ Rheumatoid arthritis. °¨ Systemic lupus erythematosus. °DIAGNOSIS  °Your caregiver will attempt to find the underlying cause of your paresthesia. Your caregiver may: °· Take your medical history. °· Perform a physical exam. °· Order various lab tests. °· Order imaging  tests. °TREATMENT  °Treatment for paresthesia depends on the underlying cause. °HOME CARE INSTRUCTIONS °· Avoid drinking alcohol. °· You may consider massage or acupuncture to help relieve your symptoms. °· Keep all follow-up appointments as directed by your caregiver. °SEEK IMMEDIATE MEDICAL CARE IF:  °· You feel weak. °· You have trouble walking or moving. °· You have problems with speech or vision. °· You feel confused. °· You cannot control your bladder or bowel movements. °· You feel numbness after an injury. °· You faint. °· Your burning or prickling feeling gets worse when walking. °· You have pain, cramps, or dizziness. °· You develop a rash. °MAKE SURE YOU: °· Understand these instructions. °· Will watch your condition. °· Will get help right away if you are not doing well or get worse. °Document Released: 01/13/2002 Document Revised: 04/17/2011 Document Reviewed: 10/14/2010 °[employer]® Patient Information ©2015 [employer]. This information is not intended to replace advice given to you by your health care provider. Make sure you discuss any questions you have with your health care provider. ° °

## 2015-02-11 ENCOUNTER — Emergency Department
Admission: EM | Admit: 2015-02-11 | Discharge: 2015-02-11 | Disposition: A | Discharge status: Home / Self Care | Payer: 59 | Attending: Emergency Medicine | Admitting: Emergency Medicine

## 2015-02-11 ENCOUNTER — Encounter: Payer: Self-pay | Admitting: Emergency Medicine

## 2015-02-11 DIAGNOSIS — Z880 Allergy status to penicillin: Secondary | ICD-10-CM | POA: Diagnosis not present

## 2015-02-11 DIAGNOSIS — M545 Low back pain: Secondary | ICD-10-CM | POA: Diagnosis present

## 2015-02-11 DIAGNOSIS — M79605 Pain in left leg: Secondary | ICD-10-CM | POA: Diagnosis not present

## 2015-02-11 DIAGNOSIS — M5430 Sciatica, unspecified side: Secondary | ICD-10-CM | POA: Diagnosis not present

## 2015-02-11 DIAGNOSIS — F17200 Nicotine dependence, unspecified, uncomplicated: Secondary | ICD-10-CM | POA: Diagnosis not present

## 2015-02-11 HISTORY — DX: Sciatica, unspecified side: M54.30

## 2015-02-11 MED ORDER — OXYCODONE-ACETAMINOPHEN 5-325 MG PO TABS: 1.0000 | ORAL_TABLET | Freq: Four times a day (QID) | ORAL | Status: DC | PRN

## 2015-02-11 MED ORDER — CYCLOBENZAPRINE HCL 10 MG PO TABS: 10.0000 mg | ORAL_TABLET | Freq: Three times a day (TID) | ORAL | Status: DC | PRN

## 2015-02-11 MED ORDER — METHYLPREDNISOLONE SODIUM SUCC 125 MG IJ SOLR
80.0000 mg | Freq: Once | INTRAMUSCULAR | Status: AC
  Administered 2015-02-11: 80 mg via INTRAMUSCULAR
  Filled 2015-02-11: qty 2

## 2015-02-11 MED ORDER — HYDROMORPHONE HCL 1 MG/ML IJ SOLN
1.0000 mg | Freq: Once | INTRAMUSCULAR | Status: AC
  Administered 2015-02-11: 1 mg via INTRAMUSCULAR
  Filled 2015-02-11: qty 1

## 2015-02-11 MED ORDER — PREDNISONE 10 MG (21) PO TBPK: ORAL_TABLET | ORAL | Status: DC

## 2015-02-11 NOTE — Discharge Instructions (Signed)
Sciatica With Rehab The sciatic nerve runs from the back down the leg and is responsible for sensation and control of the muscles in the back (posterior) side of the thigh, lower leg, and foot. Sciatica is a condition that is characterized by inflammation of this nerve.  SYMPTOMS   Signs of nerve damage, including numbness and/or weakness along the posterior side of the lower extremity.  Pain in the back of the thigh that may also travel down the leg.  Pain that worsens when sitting for long periods of time.  Occasionally, pain in the back or buttock. CAUSES  Inflammation of the sciatic nerve is the cause of sciatica. The inflammation is due to something irritating the nerve. Common sources of irritation include:  Sitting for long periods of time.  Direct trauma to the nerve.  Arthritis of the spine.  Herniated or ruptured disk.  Slipping of the vertebrae (spondylolisthesis).  Pressure from soft tissues, such as muscles or ligament-like tissue (fascia). RISK INCREASES WITH:  Sports that place pressure or stress on the spine (football or weightlifting).  Poor strength and flexibility.  Failure to warm up properly before activity.  Family history of low back pain or disk disorders.  Previous back injury or surgery.  Poor body mechanics, especially when lifting, or poor posture. PREVENTION   Warm up and stretch properly before activity.  Maintain physical fitness:  Strength, flexibility, and endurance.  Cardiovascular fitness.  Learn and use proper technique, especially with posture and lifting. When possible, have coach correct improper technique.  Avoid activities that place stress on the spine. PROGNOSIS If treated properly, then sciatica usually resolves within 6 weeks. However, occasionally surgery is necessary.  RELATED COMPLICATIONS   Permanent nerve damage, including pain, numbness, tingle, or weakness.  Chronic back pain.  Risks of surgery:  infection, bleeding, nerve damage, or damage to surrounding tissues. TREATMENT Treatment initially involves resting from any activities that aggravate your symptoms. The use of ice and medication may help reduce pain and inflammation. The use of strengthening and stretching exercises may help reduce pain with activity. These exercises may be performed at home or with referral to a therapist. A therapist may recommend further treatments, such as transcutaneous electronic nerve stimulation (TENS) or ultrasound. Your caregiver may recommend corticosteroid injections to help reduce inflammation of the sciatic nerve. If symptoms persist despite non-surgical (conservative) treatment, then surgery may be recommended. MEDICATION  If pain medication is necessary, then nonsteroidal anti-inflammatory medications, such as aspirin and ibuprofen, or other minor pain relievers, such as acetaminophen, are often recommended.  Do not take pain medication for 7 days before surgery.  Prescription pain relievers may be given if deemed necessary by your caregiver. Use only as directed and only as much as you need.  Ointments applied to the skin may be helpful.  Corticosteroid injections may be given by your caregiver. These injections should be reserved for the most serious cases, because they may only be given a certain number of times. HEAT AND COLD  Cold treatment (icing) relieves pain and reduces inflammation. Cold treatment should be applied for 10 to 15 minutes every 2 to 3 hours for inflammation and pain and immediately after any activity that aggravates your symptoms. Use ice packs or massage the area with a piece of ice (ice massage).  Heat treatment may be used prior to performing the stretching and strengthening activities prescribed by your caregiver, physical therapist, or athletic trainer. Use a heat pack or soak the injury in warm water.  SEEK MEDICAL CARE IF:  Treatment seems to offer no benefit, or  the condition worsens.  Any medications produce adverse side effects. EXERCISES  RANGE OF MOTION (ROM) AND STRETCHING EXERCISES - Sciatica Most people with sciatic will find that their symptoms worsen with either excessive bending forward (flexion) or arching at the low back (extension). The exercises which will help resolve your symptoms will focus on the opposite motion. Your physician, physical therapist or athletic trainer will help you determine which exercises will be most helpful to resolve your low back pain. Do not complete any exercises without first consulting with your clinician. Discontinue any exercises which worsen your symptoms until you speak to your clinician. If you have pain, numbness or tingling which travels down into your buttocks, leg or foot, the goal of the therapy is for these symptoms to move closer to your back and eventually resolve. Occasionally, these leg symptoms will get better, but your low back pain may worsen; this is typically an indication of progress in your rehabilitation. Be certain to be very alert to any changes in your symptoms and the activities in which you participated in the 24 hours prior to the change. Sharing this information with your clinician will allow him/her to most efficiently treat your condition. These exercises may help you when beginning to rehabilitate your injury. Your symptoms may resolve with or without further involvement from your physician, physical therapist or athletic trainer. While completing these exercises, remember:   Restoring tissue flexibility helps normal motion to return to the joints. This allows healthier, less painful movement and activity.  An effective stretch should be held for at least 30 seconds.  A stretch should never be painful. You should only feel a gentle lengthening or release in the stretched tissue. FLEXION RANGE OF MOTION AND STRETCHING EXERCISES: STRETCH - Flexion, Single Knee to Chest   Lie on a firm  bed or floor with both legs extended in front of you.  Keeping one leg in contact with the floor, bring your opposite knee to your chest. Hold your leg in place by either grabbing behind your thigh or at your knee.  Pull until you feel a gentle stretch in your low back. Hold __________ seconds.  Slowly release your grasp and repeat the exercise with the opposite side. Repeat __________ times. Complete this exercise __________ times per day.  STRETCH - Flexion, Double Knee to Chest  Lie on a firm bed or floor with both legs extended in front of you.  Keeping one leg in contact with the floor, bring your opposite knee to your chest.  Tense your stomach muscles to support your back and then lift your other knee to your chest. Hold your legs in place by either grabbing behind your thighs or at your knees.  Pull both knees toward your chest until you feel a gentle stretch in your low back. Hold __________ seconds.  Tense your stomach muscles and slowly return one leg at a time to the floor. Repeat __________ times. Complete this exercise __________ times per day.  STRETCH - Low Trunk Rotation   Lie on a firm bed or floor. Keeping your legs in front of you, bend your knees so they are both pointed toward the ceiling and your feet are flat on the floor.  Extend your arms out to the side. This will stabilize your upper body by keeping your shoulders in contact with the floor.  Gently and slowly drop both knees together to one side until  you feel a gentle stretch in your low back. Hold for __________ seconds.  Tense your stomach muscles to support your low back as you bring your knees back to the starting position. Repeat the exercise to the other side. Repeat __________ times. Complete this exercise __________ times per day  EXTENSION RANGE OF MOTION AND FLEXIBILITY EXERCISES: STRETCH - Extension, Prone on Elbows  Lie on your stomach on the floor, a bed will be too soft. Place your palms  about shoulder width apart and at the height of your head.  Place your elbows under your shoulders. If this is too painful, stack pillows under your chest.  Allow your body to relax so that your hips drop lower and make contact more completely with the floor.  Hold this position for __________ seconds.  Slowly return to lying flat on the floor. Repeat __________ times. Complete this exercise __________ times per day.  RANGE OF MOTION - Extension, Prone Press Ups  Lie on your stomach on the floor, a bed will be too soft. Place your palms about shoulder width apart and at the height of your head.  Keeping your back as relaxed as possible, slowly straighten your elbows while keeping your hips on the floor. You may adjust the placement of your hands to maximize your comfort. As you gain motion, your hands will come more underneath your shoulders.  Hold this position __________ seconds.  Slowly return to lying flat on the floor. Repeat __________ times. Complete this exercise __________ times per day.  STRENGTHENING EXERCISES - Sciatica  These exercises may help you when beginning to rehabilitate your injury. These exercises should be done near your "sweet spot." This is the neutral, low-back arch, somewhere between fully rounded and fully arched, that is your least painful position. When performed in this safe range of motion, these exercises can be used for people who have either a flexion or extension based injury. These exercises may resolve your symptoms with or without further involvement from your physician, physical therapist or athletic trainer. While completing these exercises, remember:   Muscles can gain both the endurance and the strength needed for everyday activities through controlled exercises.  Complete these exercises as instructed by your physician, physical therapist or athletic trainer. Progress with the resistance and repetition exercises only as your caregiver  advises.  You may experience muscle soreness or fatigue, but the pain or discomfort you are trying to eliminate should never worsen during these exercises. If this pain does worsen, stop and make certain you are following the directions exactly. If the pain is still present after adjustments, discontinue the exercise until you can discuss the trouble with your clinician. STRENGTHENING - Deep Abdominals, Pelvic Tilt   Lie on a firm bed or floor. Keeping your legs in front of you, bend your knees so they are both pointed toward the ceiling and your feet are flat on the floor.  Tense your lower abdominal muscles to press your low back into the floor. This motion will rotate your pelvis so that your tail bone is scooping upwards rather than pointing at your feet or into the floor.  With a gentle tension and even breathing, hold this position for __________ seconds. Repeat __________ times. Complete this exercise __________ times per day.  STRENGTHENING - Abdominals, Crunches   Lie on a firm bed or floor. Keeping your legs in front of you, bend your knees so they are both pointed toward the ceiling and your feet are flat on the  floor. Cross your arms over your chest.  Slightly tip your chin down without bending your neck.  Tense your abdominals and slowly lift your trunk high enough to just clear your shoulder blades. Lifting higher can put excessive stress on the low back and does not further strengthen your abdominal muscles.  Control your return to the starting position. Repeat __________ times. Complete this exercise __________ times per day.  STRENGTHENING - Quadruped, Opposite UE/LE Lift  Assume a hands and knees position on a firm surface. Keep your hands under your shoulders and your knees under your hips. You may place padding under your knees for comfort.  Find your neutral spine and gently tense your abdominal muscles so that you can maintain this position. Your shoulders and hips  should form a rectangle that is parallel with the floor and is not twisted.  Keeping your trunk steady, lift your right hand no higher than your shoulder and then your left leg no higher than your hip. Make sure you are not holding your breath. Hold this position __________ seconds.  Continuing to keep your abdominal muscles tense and your back steady, slowly return to your starting position. Repeat with the opposite arm and leg. Repeat __________ times. Complete this exercise __________ times per day.  STRENGTHENING - Abdominals and Quadriceps, Straight Leg Raise   Lie on a firm bed or floor with both legs extended in front of you.  Keeping one leg in contact with the floor, bend the other knee so that your foot can rest flat on the floor.  Find your neutral spine, and tense your abdominal muscles to maintain your spinal position throughout the exercise.  Slowly lift your straight leg off the floor about 6 inches for a count of 15, making sure to not hold your breath.  Still keeping your neutral spine, slowly lower your leg all the way to the floor. Repeat this exercise with each leg __________ times. Complete this exercise __________ times per day. POSTURE AND BODY MECHANICS CONSIDERATIONS - Sciatica Keeping correct posture when sitting, standing or completing your activities will reduce the stress put on different body tissues, allowing injured tissues a chance to heal and limiting painful experiences. The following are general guidelines for improved posture. Your physician or physical therapist will provide you with any instructions specific to your needs. While reading these guidelines, remember:  The exercises prescribed by your provider will help you have the flexibility and strength to maintain correct postures.  The correct posture provides the optimal environment for your joints to work. All of your joints have less wear and tear when properly supported by a spine with good posture.  This means you will experience a healthier, less painful body.  Correct posture must be practiced with all of your activities, especially prolonged sitting and standing. Correct posture is as important when doing repetitive low-stress activities (typing) as it is when doing a single heavy-load activity (lifting). RESTING POSITIONS Consider which positions are most painful for you when choosing a resting position. If you have pain with flexion-based activities (sitting, bending, stooping, squatting), choose a position that allows you to rest in a less flexed posture. You would want to avoid curling into a fetal position on your side. If your pain worsens with extension-based activities (prolonged standing, working overhead), avoid resting in an extended position such as sleeping on your stomach. Most people will find more comfort when they rest with their spine in a more neutral position, neither too rounded nor too  arched. Lying on a non-sagging bed on your side with a pillow between your knees, or on your back with a pillow under your knees will often provide some relief. Keep in mind, being in any one position for a prolonged period of time, no matter how correct your posture, can still lead to stiffness. PROPER SITTING POSTURE In order to minimize stress and discomfort on your spine, you must sit with correct posture Sitting with good posture should be effortless for a healthy body. Returning to good posture is a gradual process. Many people can work toward this most comfortably by using various supports until they have the flexibility and strength to maintain this posture on their own. When sitting with proper posture, your ears will fall over your shoulders and your shoulders will fall over your hips. You should use the back of the chair to support your upper back. Your low back will be in a neutral position, just slightly arched. You may place a small pillow or folded towel at the base of your low back  for support.  When working at a desk, create an environment that supports good, upright posture. Without extra support, muscles fatigue and lead to excessive strain on joints and other tissues. Keep these recommendations in mind: CHAIR:   A chair should be able to slide under your desk when your back makes contact with the back of the chair. This allows you to work closely.  The chair's height should allow your eyes to be level with the upper part of your monitor and your hands to be slightly lower than your elbows. BODY POSITION  Your feet should make contact with the floor. If this is not possible, use a foot rest.  Keep your ears over your shoulders. This will reduce stress on your neck and low back. INCORRECT SITTING POSTURES   If you are feeling tired and unable to assume a healthy sitting posture, do not slouch or slump. This puts excessive strain on your back tissues, causing more damage and pain. Healthier options include:  Using more support, like a lumbar pillow.  Switching tasks to something that requires you to be upright or walking.  Talking a brief walk.  Lying down to rest in a neutral-spine position. PROLONGED STANDING WHILE SLIGHTLY LEANING FORWARD  When completing a task that requires you to lean forward while standing in one place for a long time, place either foot up on a stationary 2-4 inch high object to help maintain the best posture. When both feet are on the ground, the low back tends to lose its slight inward curve. If this curve flattens (or becomes too large), then the back and your other joints will experience too much stress, fatigue more quickly and can cause pain.  CORRECT STANDING POSTURES Proper standing posture should be assumed with all daily activities, even if they only take a few moments, like when brushing your teeth. As in sitting, your ears should fall over your shoulders and your shoulders should fall over your hips. You should keep a slight  tension in your abdominal muscles to brace your spine. Your tailbone should point down to the ground, not behind your body, resulting in an over-extended swayback posture.  INCORRECT STANDING POSTURES  Common incorrect standing postures include a forward head, locked knees and/or an excessive swayback. WALKING Walk with an upright posture. Your ears, shoulders and hips should all line-up. PROLONGED ACTIVITY IN A FLEXED POSITION When completing a task that requires you to bend forward  at your waist or lean over a low surface, try to find a way to stabilize 3 of 4 of your limbs. You can place a hand or elbow on your thigh or rest a knee on the surface you are reaching across. This will provide you more stability so that your muscles do not fatigue as quickly. By keeping your knees relaxed, or slightly bent, you will also reduce stress across your low back. CORRECT LIFTING TECHNIQUES DO :   Assume a wide stance. This will provide you more stability and the opportunity to get as close as possible to the object which you are lifting.  Tense your abdominals to brace your spine; then bend at the knees and hips. Keeping your back locked in a neutral-spine position, lift using your leg muscles. Lift with your legs, keeping your back straight.  Test the weight of unknown objects before attempting to lift them.  Try to keep your elbows locked down at your sides in order get the best strength from your shoulders when carrying an object.  Always ask for help when lifting heavy or awkward objects. INCORRECT LIFTING TECHNIQUES DO NOT:   Lock your knees when lifting, even if it is a small object.  Bend and twist. Pivot at your feet or move your feet when needing to change directions.  Assume that you cannot safely pick up a paperclip without proper posture.   This information is not intended to replace advice given to you by your health care provider. Make sure you discuss any questions you have with  your health care provider.   Document Released: 01/23/2005 Document Revised: 06/09/2014 Document Reviewed: 05/07/2008 [employer] Interactive Patient Education 2016 [employer]  Sciatica With Rehab The sciatic nerve runs from the back down the leg and is responsible for sensation and control of the muscles in the back (posterior) side of the thigh, lower leg, and foot. Sciatica is a condition that is characterized by inflammation of this nerve.  SYMPTOMS   Signs of nerve damage, including numbness and/or weakness along the posterior side of the lower extremity.  Pain in the back of the thigh that may also travel down the leg.  Pain that worsens when sitting for long periods of time.  Occasionally, pain in the back or buttock. CAUSES  Inflammation of the sciatic nerve is the cause of sciatica. The inflammation is due to something irritating the nerve. Common sources of irritation include:  Sitting for long periods of time.  Direct trauma to the nerve.  Arthritis of the spine.  Herniated or ruptured disk.  Slipping of the vertebrae (spondylolisthesis).  Pressure from soft tissues, such as muscles or ligament-like tissue (fascia). RISK INCREASES WITH:  Sports that place pressure or stress on the spine (football or weightlifting).  Poor strength and flexibility.  Failure to warm up properly before activity.  Family history of low back pain or disk disorders.  Previous back injury or surgery.  Poor body mechanics, especially when lifting, or poor posture. PREVENTION   Warm up and stretch properly before activity.  Maintain physical fitness:  Strength, flexibility, and endurance.  Cardiovascular fitness.  Learn and use proper technique, especially with posture and lifting. When possible, have coach correct improper technique.  Avoid activities that place stress on the spine. PROGNOSIS If treated properly, then sciatica usually resolves within 6 weeks. However,  occasionally surgery is necessary.  RELATED COMPLICATIONS   Permanent nerve damage, including pain, numbness, tingle, or weakness.  Chronic back pain.  Risks  of surgery: infection, bleeding, nerve damage, or damage to surrounding tissues. TREATMENT Treatment initially involves resting from any activities that aggravate your symptoms. The use of ice and medication may help reduce pain and inflammation. The use of strengthening and stretching exercises may help reduce pain with activity. These exercises may be performed at home or with referral to a therapist. A therapist may recommend further treatments, such as transcutaneous electronic nerve stimulation (TENS) or ultrasound. Your caregiver may recommend corticosteroid injections to help reduce inflammation of the sciatic nerve. If symptoms persist despite non-surgical (conservative) treatment, then surgery may be recommended. MEDICATION  If pain medication is necessary, then nonsteroidal anti-inflammatory medications, such as aspirin and ibuprofen, or other minor pain relievers, such as acetaminophen, are often recommended.  Do not take pain medication for 7 days before surgery.  Prescription pain relievers may be given if deemed necessary by your caregiver. Use only as directed and only as much as you need.  Ointments applied to the skin may be helpful.  Corticosteroid injections may be given by your caregiver. These injections should be reserved for the most serious cases, because they may only be given a certain number of times. HEAT AND COLD  Cold treatment (icing) relieves pain and reduces inflammation. Cold treatment should be applied for 10 to 15 minutes every 2 to 3 hours for inflammation and pain and immediately after any activity that aggravates your symptoms. Use ice packs or massage the area with a piece of ice (ice massage).  Heat treatment may be used prior to performing the stretching and strengthening activities prescribed  by your caregiver, physical therapist, or athletic trainer. Use a heat pack or soak the injury in warm water. SEEK MEDICAL CARE IF:  Treatment seems to offer no benefit, or the condition worsens.  Any medications produce adverse side effects. EXERCISES  RANGE OF MOTION (ROM) AND STRETCHING EXERCISES - Sciatica Most people with sciatic will find that their symptoms worsen with either excessive bending forward (flexion) or arching at the low back (extension). The exercises which will help resolve your symptoms will focus on the opposite motion. Your physician, physical therapist or athletic trainer will help you determine which exercises will be most helpful to resolve your low back pain. Do not complete any exercises without first consulting with your clinician. Discontinue any exercises which worsen your symptoms until you speak to your clinician. If you have pain, numbness or tingling which travels down into your buttocks, leg or foot, the goal of the therapy is for these symptoms to move closer to your back and eventually resolve. Occasionally, these leg symptoms will get better, but your low back pain may worsen; this is typically an indication of progress in your rehabilitation. Be certain to be very alert to any changes in your symptoms and the activities in which you participated in the 24 hours prior to the change. Sharing this information with your clinician will allow him/her to most efficiently treat your condition. These exercises may help you when beginning to rehabilitate your injury. Your symptoms may resolve with or without further involvement from your physician, physical therapist or athletic trainer. While completing these exercises, remember:   Restoring tissue flexibility helps normal motion to return to the joints. This allows healthier, less painful movement and activity.  An effective stretch should be held for at least 30 seconds.  A stretch should never be painful. You should  only feel a gentle lengthening or release in the stretched tissue. FLEXION RANGE OF MOTION  AND STRETCHING EXERCISES: STRETCH - Flexion, Single Knee to Chest   Lie on a firm bed or floor with both legs extended in front of you.  Keeping one leg in contact with the floor, bring your opposite knee to your chest. Hold your leg in place by either grabbing behind your thigh or at your knee.  Pull until you feel a gentle stretch in your low back. Hold __________ seconds.  Slowly release your grasp and repeat the exercise with the opposite side. Repeat __________ times. Complete this exercise __________ times per day.  STRETCH - Flexion, Double Knee to Chest  Lie on a firm bed or floor with both legs extended in front of you.  Keeping one leg in contact with the floor, bring your opposite knee to your chest.  Tense your stomach muscles to support your back and then lift your other knee to your chest. Hold your legs in place by either grabbing behind your thighs or at your knees.  Pull both knees toward your chest until you feel a gentle stretch in your low back. Hold __________ seconds.  Tense your stomach muscles and slowly return one leg at a time to the floor. Repeat __________ times. Complete this exercise __________ times per day.  STRETCH - Low Trunk Rotation   Lie on a firm bed or floor. Keeping your legs in front of you, bend your knees so they are both pointed toward the ceiling and your feet are flat on the floor.  Extend your arms out to the side. This will stabilize your upper body by keeping your shoulders in contact with the floor.  Gently and slowly drop both knees together to one side until you feel a gentle stretch in your low back. Hold for __________ seconds.  Tense your stomach muscles to support your low back as you bring your knees back to the starting position. Repeat the exercise to the other side. Repeat __________ times. Complete this exercise __________ times per  day  EXTENSION RANGE OF MOTION AND FLEXIBILITY EXERCISES: STRETCH - Extension, Prone on Elbows  Lie on your stomach on the floor, a bed will be too soft. Place your palms about shoulder width apart and at the height of your head.  Place your elbows under your shoulders. If this is too painful, stack pillows under your chest.  Allow your body to relax so that your hips drop lower and make contact more completely with the floor.  Hold this position for __________ seconds.  Slowly return to lying flat on the floor. Repeat __________ times. Complete this exercise __________ times per day.  RANGE OF MOTION - Extension, Prone Press Ups  Lie on your stomach on the floor, a bed will be too soft. Place your palms about shoulder width apart and at the height of your head.  Keeping your back as relaxed as possible, slowly straighten your elbows while keeping your hips on the floor. You may adjust the placement of your hands to maximize your comfort. As you gain motion, your hands will come more underneath your shoulders.  Hold this position __________ seconds.  Slowly return to lying flat on the floor. Repeat __________ times. Complete this exercise __________ times per day.  STRENGTHENING EXERCISES - Sciatica  These exercises may help you when beginning to rehabilitate your injury. These exercises should be done near your "sweet spot." This is the neutral, low-back arch, somewhere between fully rounded and fully arched, that is your least painful position. When performed in  this safe range of motion, these exercises can be used for people who have either a flexion or extension based injury. These exercises may resolve your symptoms with or without further involvement from your physician, physical therapist or athletic trainer. While completing these exercises, remember:   Muscles can gain both the endurance and the strength needed for everyday activities through controlled exercises.  Complete  these exercises as instructed by your physician, physical therapist or athletic trainer. Progress with the resistance and repetition exercises only as your caregiver advises.  You may experience muscle soreness or fatigue, but the pain or discomfort you are trying to eliminate should never worsen during these exercises. If this pain does worsen, stop and make certain you are following the directions exactly. If the pain is still present after adjustments, discontinue the exercise until you can discuss the trouble with your clinician. STRENGTHENING - Deep Abdominals, Pelvic Tilt   Lie on a firm bed or floor. Keeping your legs in front of you, bend your knees so they are both pointed toward the ceiling and your feet are flat on the floor.  Tense your lower abdominal muscles to press your low back into the floor. This motion will rotate your pelvis so that your tail bone is scooping upwards rather than pointing at your feet or into the floor.  With a gentle tension and even breathing, hold this position for __________ seconds. Repeat __________ times. Complete this exercise __________ times per day.  STRENGTHENING - Abdominals, Crunches   Lie on a firm bed or floor. Keeping your legs in front of you, bend your knees so they are both pointed toward the ceiling and your feet are flat on the floor. Cross your arms over your chest.  Slightly tip your chin down without bending your neck.  Tense your abdominals and slowly lift your trunk high enough to just clear your shoulder blades. Lifting higher can put excessive stress on the low back and does not further strengthen your abdominal muscles.  Control your return to the starting position. Repeat __________ times. Complete this exercise __________ times per day.  STRENGTHENING - Quadruped, Opposite UE/LE Lift  Assume a hands and knees position on a firm surface. Keep your hands under your shoulders and your knees under your hips. You may place padding  under your knees for comfort.  Find your neutral spine and gently tense your abdominal muscles so that you can maintain this position. Your shoulders and hips should form a rectangle that is parallel with the floor and is not twisted.  Keeping your trunk steady, lift your right hand no higher than your shoulder and then your left leg no higher than your hip. Make sure you are not holding your breath. Hold this position __________ seconds.  Continuing to keep your abdominal muscles tense and your back steady, slowly return to your starting position. Repeat with the opposite arm and leg. Repeat __________ times. Complete this exercise __________ times per day.  STRENGTHENING - Abdominals and Quadriceps, Straight Leg Raise   Lie on a firm bed or floor with both legs extended in front of you.  Keeping one leg in contact with the floor, bend the other knee so that your foot can rest flat on the floor.  Find your neutral spine, and tense your abdominal muscles to maintain your spinal position throughout the exercise.  Slowly lift your straight leg off the floor about 6 inches for a count of 15, making sure to not hold your breath.  Still keeping your neutral spine, slowly lower your leg all the way to the floor. Repeat this exercise with each leg __________ times. Complete this exercise __________ times per day. POSTURE AND BODY MECHANICS CONSIDERATIONS - Sciatica Keeping correct posture when sitting, standing or completing your activities will reduce the stress put on different body tissues, allowing injured tissues a chance to heal and limiting painful experiences. The following are general guidelines for improved posture. Your physician or physical therapist will provide you with any instructions specific to your needs. While reading these guidelines, remember:  The exercises prescribed by your provider will help you have the flexibility and strength to maintain correct postures.  The correct  posture provides the optimal environment for your joints to work. All of your joints have less wear and tear when properly supported by a spine with good posture. This means you will experience a healthier, less painful body.  Correct posture must be practiced with all of your activities, especially prolonged sitting and standing. Correct posture is as important when doing repetitive low-stress activities (typing) as it is when doing a single heavy-load activity (lifting). RESTING POSITIONS Consider which positions are most painful for you when choosing a resting position. If you have pain with flexion-based activities (sitting, bending, stooping, squatting), choose a position that allows you to rest in a less flexed posture. You would want to avoid curling into a fetal position on your side. If your pain worsens with extension-based activities (prolonged standing, working overhead), avoid resting in an extended position such as sleeping on your stomach. Most people will find more comfort when they rest with their spine in a more neutral position, neither too rounded nor too arched. Lying on a non-sagging bed on your side with a pillow between your knees, or on your back with a pillow under your knees will often provide some relief. Keep in mind, being in any one position for a prolonged period of time, no matter how correct your posture, can still lead to stiffness. PROPER SITTING POSTURE In order to minimize stress and discomfort on your spine, you must sit with correct posture Sitting with good posture should be effortless for a healthy body. Returning to good posture is a gradual process. Many people can work toward this most comfortably by using various supports until they have the flexibility and strength to maintain this posture on their own. When sitting with proper posture, your ears will fall over your shoulders and your shoulders will fall over your hips. You should use the back of the chair to  support your upper back. Your low back will be in a neutral position, just slightly arched. You may place a small pillow or folded towel at the base of your low back for support.  When working at a desk, create an environment that supports good, upright posture. Without extra support, muscles fatigue and lead to excessive strain on joints and other tissues. Keep these recommendations in mind: CHAIR:   A chair should be able to slide under your desk when your back makes contact with the back of the chair. This allows you to work closely.  The chair's height should allow your eyes to be level with the upper part of your monitor and your hands to be slightly lower than your elbows. BODY POSITION  Your feet should make contact with the floor. If this is not possible, use a foot rest.  Keep your ears over your shoulders. This will reduce stress on your neck and  low back. INCORRECT SITTING POSTURES   If you are feeling tired and unable to assume a healthy sitting posture, do not slouch or slump. This puts excessive strain on your back tissues, causing more damage and pain. Healthier options include:  Using more support, like a lumbar pillow.  Switching tasks to something that requires you to be upright or walking.  Talking a brief walk.  Lying down to rest in a neutral-spine position. PROLONGED STANDING WHILE SLIGHTLY LEANING FORWARD  When completing a task that requires you to lean forward while standing in one place for a long time, place either foot up on a stationary 2-4 inch high object to help maintain the best posture. When both feet are on the ground, the low back tends to lose its slight inward curve. If this curve flattens (or becomes too large), then the back and your other joints will experience too much stress, fatigue more quickly and can cause pain.  CORRECT STANDING POSTURES Proper standing posture should be assumed with all daily activities, even if they only take a few moments,  like when brushing your teeth. As in sitting, your ears should fall over your shoulders and your shoulders should fall over your hips. You should keep a slight tension in your abdominal muscles to brace your spine. Your tailbone should point down to the ground, not behind your body, resulting in an over-extended swayback posture.  INCORRECT STANDING POSTURES  Common incorrect standing postures include a forward head, locked knees and/or an excessive swayback. WALKING Walk with an upright posture. Your ears, shoulders and hips should all line-up. PROLONGED ACTIVITY IN A FLEXED POSITION When completing a task that requires you to bend forward at your waist or lean over a low surface, try to find a way to stabilize 3 of 4 of your limbs. You can place a hand or elbow on your thigh or rest a knee on the surface you are reaching across. This will provide you more stability so that your muscles do not fatigue as quickly. By keeping your knees relaxed, or slightly bent, you will also reduce stress across your low back. CORRECT LIFTING TECHNIQUES DO :   Assume a wide stance. This will provide you more stability and the opportunity to get as close as possible to the object which you are lifting.  Tense your abdominals to brace your spine; then bend at the knees and hips. Keeping your back locked in a neutral-spine position, lift using your leg muscles. Lift with your legs, keeping your back straight.  Test the weight of unknown objects before attempting to lift them.  Try to keep your elbows locked down at your sides in order get the best strength from your shoulders when carrying an object.  Always ask for help when lifting heavy or awkward objects. INCORRECT LIFTING TECHNIQUES DO NOT:   Lock your knees when lifting, even if it is a small object.  Bend and twist. Pivot at your feet or move your feet when needing to change directions.  Assume that you cannot safely pick up a paperclip without proper  posture.   This information is not intended to replace advice given to you by your health care provider. Make sure you discuss any questions you have with your health care provider.   Document Released: 01/23/2005 Document Revised: 06/09/2014 Document Reviewed: 05/07/2008 Elsevier Interactive Patient Education 2016 [employer]   Take pain medicine as directed. As pain improves, begin exercises and stretching. Follow-up with the orthopedic if not  improving. Return to the emergency room for any concerns.

## 2015-02-11 NOTE — ED Notes (Signed)
Developed left side lower back pain which radiates into left leg  Ambulates well to treatment room  Denies any injury

## 2015-02-11 NOTE — ED Notes (Signed)
Pt discharged to home.  Family member driving.  Discharge instructions reviewed.  Verbalized understanding.  No questions or concerns at this time.  Teach back verified.  Pt in NAD.  No items left in ED.   

## 2015-02-11 NOTE — ED Notes (Signed)
Pt c/o left lower back pain. Has hx sciatic pain. Denies loss of bowel or bladder.

## 2015-02-11 NOTE — ED Provider Notes (Addendum)
[facility] Emergency Department Provider Note  ____________________________________________  Time seen: Approximately 7:31 PM  I have reviewed the triage vital signs and the nursing notes.   HISTORY  Chief Complaint Back Pain    HPI Ashley Olson is a 30 y.o. female with history of sciatica who presents with acute flare involving the left lower back radiating down the left leg, with some numbness and tingling. No weakness. No abdominal pain, fevers or chills, urinary or bowel changes. No chest pain or shortness of breath. She has mild congestion. He has no significant medical history.   Past Medical History  Diagnosis Date  . Sciatic pain     There are no active problems to display for this patient.   Past Surgical History  Procedure Laterality Date  . Appendectomy      Current Outpatient Rx  Name  Route  Sig  Dispense  Refill  . cyclobenzaprine (FLEXERIL) 10 MG tablet   Oral   Take 1 tablet (10 mg total) by mouth every 8 (eight) hours as needed for muscle spasms.   21 tablet   0   . ibuprofen (ADVIL,MOTRIN) 800 MG tablet   Oral   Take 1 tablet (800 mg total) by mouth every 8 (eight) hours as needed.   30 tablet   0   . ketorolac (TORADOL) 10 MG tablet   Oral   Take 1 tablet (10 mg total) by mouth every 6 (six) hours as needed.   20 tablet   0   . oxyCODONE-acetaminophen (ROXICET) 5-325 MG tablet   Oral   Take 1 tablet by mouth every 6 (six) hours as needed.   20 tablet   0   . predniSONE (STERAPRED UNI-PAK 21 TAB) 10 MG (21) TBPK tablet      6 tablets on day 1, 5 tablets on day 2, 4 tablets on day 3, etc...   21 tablet   0     Allergies Amoxicillin  History reviewed. No pertinent family history.  Social History Social History  Substance Use Topics  . Smoking status: Current Some Day Smoker  . Smokeless tobacco: None  . Alcohol Use: No    Review of Systems Constitutional: No fever/chills Eyes: No visual  changes. ENT: No sore throat. Cardiovascular: Denies chest pain. Respiratory: Denies shortness of breath. Gastrointestinal: No abdominal pain.  No nausea, no vomiting.  No diarrhea.  No constipation. Genitourinary: Negative for dysuria. Skin: Negative for rash. Neurological: Negative for headaches, focal weakness or numbness. 10-point ROS otherwise negative.  ____________________________________________   PHYSICAL EXAM:  VITAL SIGNS: ED Triage Vitals  Enc Vitals Group     BP 02/11/15 1836 143/75 mmHg     Pulse Rate 02/11/15 1836 90     Resp 02/11/15 1836 18     Temp 02/11/15 1836 98.1 F (36.7 C)     Temp Source 02/11/15 1836 Oral     SpO2 02/11/15 1836 95 %     Weight 02/11/15 1846 385 lb (174.635 kg)     Height 02/11/15 1836 6' (1.829 m)     Head Cir --      Peak Flow --      Pain Score 02/11/15 1836 10     Pain Loc --      Pain Edu? --      Excl. in GC? --     Constitutional: Alert and oriented. Well appearing and in no acute distress. Eyes: Conjunctivae are normal. Mouth/Throat: Mucous membranes are moist.  Oropharynx non-erythematous. No lesions. Neck:  Supple.  Cardiovascular: Normal rate, regular rhythm. Grossly normal heart sounds.  Good peripheral circulation. Respiratory: Normal respiratory effort.  No retractions. Lungs CTAB. Musculoskeletal:   tender over the lumbar spine and paraspinal muscles.  rom limited of spine, hip.  Pos left SLR, neg right SLR. nontender over the greater trochanter on right.  Neurologic:  Normal speech and language. No gross focal neurologic deficits are appreciated. No gait instability. Skin:  Skin is warm, dry and intact. No rash noted. Psychiatric: Mood and affect are normal. Speech and behavior are normal.  ____________________________________________   LABS (all labs ordered are listed, but only abnormal results are displayed)  Labs Reviewed - No data to  display ____________________________________________  EKG   ____________________________________________  RADIOLOGY  Trey Sailorsffered xrays but declines.  She reports xrays of back about 3-4 yrs ago. ____________________________________________   PROCEDURES  Procedure(s) performed: None  Critical Care performed: No  ____________________________________________   INITIAL IMPRESSION / ASSESSMENT AND PLAN / ED COURSE  Pertinent labs & imaging results that were available during my care of the patient were reviewed by me and considered in my medical decision making (see chart for details).  30 year old with acute left sciatica. Offered x-rays but she declines. She has had x-rays in the past and this seems reasonable. She is given Solu-Medrol and Dilaudid IM in the ER. She will start prednisone taper tomorrow. Also given Flexeril and Percocet. She can follow-up with orthopedics if not improving. Return to the emergency room for any concerns. ____________________________________________   FINAL CLINICAL IMPRESSION(S) / ED DIAGNOSES  Final diagnoses:  Sciatic leg pain      Ignacia BayleyRobert Kynley Metzger, PA-C 02/11/15 2002  Jeanmarie PlantJames A McShane, MD 02/11/15 2017  Ignacia Bayleyobert Saphire Barnhart, PA-C 02/11/15 [id number]  Jeanmarie PlantJames A McShane, MD 02/11/15 2300

## 2015-09-29 ENCOUNTER — Emergency Department
Admission: EM | Admit: 2015-09-29 | Discharge: 2015-09-29 | Disposition: A | Discharge status: Home / Self Care | Payer: 59 | Attending: Emergency Medicine | Admitting: Emergency Medicine

## 2015-09-29 ENCOUNTER — Encounter: Payer: Self-pay | Admitting: Medical Oncology

## 2015-09-29 DIAGNOSIS — M5432 Sciatica, left side: Secondary | ICD-10-CM

## 2015-09-29 DIAGNOSIS — M5442 Lumbago with sciatica, left side: Secondary | ICD-10-CM | POA: Diagnosis not present

## 2015-09-29 DIAGNOSIS — F17200 Nicotine dependence, unspecified, uncomplicated: Secondary | ICD-10-CM | POA: Insufficient documentation

## 2015-09-29 DIAGNOSIS — M545 Low back pain: Secondary | ICD-10-CM | POA: Diagnosis present

## 2015-09-29 DIAGNOSIS — M546 Pain in thoracic spine: Secondary | ICD-10-CM | POA: Insufficient documentation

## 2015-09-29 DIAGNOSIS — M5414 Radiculopathy, thoracic region: Secondary | ICD-10-CM

## 2015-09-29 MED ORDER — PREDNISONE 10 MG (21) PO TBPK: ORAL_TABLET | ORAL | 0 refills | Status: DC

## 2015-09-29 MED ORDER — TRAMADOL HCL 50 MG PO TABS: 50.0000 mg | ORAL_TABLET | Freq: Four times a day (QID) | ORAL | 0 refills | Status: DC | PRN

## 2015-09-29 NOTE — ED Triage Notes (Signed)
Left lower back pain x 2 days with radiation of pain into leg.

## 2015-09-29 NOTE — ED Provider Notes (Signed)
[facility] Emergency Department Provider Note ____________________________________________  Time seen: Approximately 12:13 PM  I have reviewed the triage vital signs and the nursing notes.   HISTORY  Chief Complaint Back Pain    HPI Ashley Olson is a 30 y.o. female who presents to the emergency department for evaluation of back pain. She states that the pain in her lower back radiates down the front of her left leg to her knee. Pain also radiates across her lower back into her right side and she is also having similar pain from the left shoulder blade that radiates up into the left anterior shoulder. She has taken Flexeril without relief. She reports that she sits about 16 hours a day at her job and feels that this makes the pain worse. Pain is described as pins and needles and burning pain.Pain is 10/10.  Past Medical History:  Diagnosis Date  . Sciatic pain     There are no active problems to display for this patient.   Past Surgical History:  Procedure Laterality Date  . APPENDECTOMY      Prior to Admission medications   Medication Sig Start Date End Date Taking? Authorizing Provider  cyclobenzaprine (FLEXERIL) 10 MG tablet Take 1 tablet (10 mg total) by mouth every 8 (eight) hours as needed for muscle spasms. 02/11/15   Ignacia Bayleyobert Tumey, PA-C  predniSONE (STERAPRED UNI-PAK 21 TAB) 10 MG (21) TBPK tablet Take 6 tablets on day 1 Take 5 tablets on day 2 Take 4 tablets on day 3 Take 3 tablets on day 4 Take 2 tablets on day 5 Take 1 tablet on day 6 09/29/15   Marianita Botkin B Chantale Leugers, FNP  traMADol (ULTRAM) 50 MG tablet Take 1 tablet (50 mg total) by mouth every 6 (six) hours as needed. 09/29/15   Chinita Pesterari B Mathius Birkeland, FNP    Allergies Amoxicillin  No family history on file.  Social History Social History  Substance Use Topics  . Smoking status: Current Some Day Smoker  . Smokeless tobacco: Not on file  . Alcohol use No    Review of  Systems Constitutional: No recent illness. Cardiovascular: Denies chest pain or palpitations. Respiratory: Denies shortness of breath. Musculoskeletal: Pain in Back. Skin: Negative for rash, wound, lesion. Neurological: Negative for focal weakness. Negative for loss of bowel or bladder control.  ____________________________________________   PHYSICAL EXAM:  VITAL SIGNS: ED Triage Vitals  Enc Vitals Group     BP 09/29/15 1154 129/84     Pulse Rate 09/29/15 1154 95     Resp 09/29/15 1154 18     Temp 09/29/15 1154 98 F (36.7 C)     Temp Source 09/29/15 1154 Oral     SpO2 09/29/15 1154 98 %     Weight 09/29/15 1153 (!) 385 lb (174.6 kg)     Height 09/29/15 1153 6' (1.829 m)     Head Circumference --      Peak Flow --      Pain Score 09/29/15 1153 10     Pain Loc --      Pain Edu? --      Excl. in GC? --     Constitutional: Alert and oriented. Well appearing and in no acute distress. Eyes: Conjunctivae are normal. EOMI. Head: Atraumatic. Neck: No stridor.  Respiratory: Normal respiratory effort.   Musculoskeletal: Full range of motion of all extremities noted with steady gait observed. No increase in pain to palpation over the midline lumbar or thoracic spine. Neurologic:  Normal speech and language. No gross focal neurologic deficits are appreciated. Speech is normal. No gait instability. Skin:  Skin is warm, dry and intact. Atraumatic. No rash is noted. Psychiatric: Mood and affect are normal. Speech and behavior are normal.  ____________________________________________   LABS (all labs ordered are listed, but only abnormal results are displayed)  Labs Reviewed - No data to display ____________________________________________  RADIOLOGY  Not indicated ____________________________________________   PROCEDURES  Procedure(s) performed: None   ____________________________________________   INITIAL IMPRESSION / ASSESSMENT AND PLAN / ED COURSE  Pertinent  labs & imaging results that were available during my care of the patient were reviewed by me and considered in my medical decision making (see chart for details).  Patient was given prescriptions for prednisone and tramadol. She is to continue taking her Flexeril as previously prescribed. She was encouraged to follow up with the primary care provider of her choice for symptoms that are not improving over the week. She was given a work excuse for the next 2 days and encouraged to stretch frequently when she does return to work. She was advised to return to the emergency department for symptoms that change or worsen if she is unable to schedule an appointment. ____________________________________________   FINAL CLINICAL IMPRESSION(S) / ED DIAGNOSES  Final diagnoses:  Radicular pain of thoracic region  Sciatica of left side       Chinita PesterCari B Ilia Dimaano, FNP 09/29/15 1302    Nita Sicklearolina Veronese, MD 09/30/15 1048

## 2015-09-29 NOTE — ED Notes (Signed)
Pt in via triage with complaints of lower back pain radiating down left leg and up to left shoulder x 2 days.  Pt denies any recent injury, reports taking a muscle relaxer but with no relief.  Pt ambulatory to room, A/Ox4, no immediate distress noted.

## 2015-12-04 ENCOUNTER — Encounter: Payer: Self-pay | Admitting: Emergency Medicine

## 2015-12-04 ENCOUNTER — Emergency Department
Admission: EM | Admit: 2015-12-04 | Discharge: 2015-12-04 | Disposition: A | Discharge status: Home / Self Care | Payer: 59 | Attending: Emergency Medicine | Admitting: Emergency Medicine

## 2015-12-04 DIAGNOSIS — N760 Acute vaginitis: Secondary | ICD-10-CM | POA: Insufficient documentation

## 2015-12-04 DIAGNOSIS — B9689 Other specified bacterial agents as the cause of diseases classified elsewhere: Secondary | ICD-10-CM

## 2015-12-04 DIAGNOSIS — F17200 Nicotine dependence, unspecified, uncomplicated: Secondary | ICD-10-CM | POA: Diagnosis not present

## 2015-12-04 DIAGNOSIS — R102 Pelvic and perineal pain: Secondary | ICD-10-CM | POA: Diagnosis present

## 2015-12-04 DIAGNOSIS — J029 Acute pharyngitis, unspecified: Secondary | ICD-10-CM | POA: Insufficient documentation

## 2015-12-04 LAB — CHLAMYDIA/NGC RT PCR (ARMC ONLY)
CHLAMYDIA TR: NOT DETECTED
N GONORRHOEAE: NOT DETECTED

## 2015-12-04 LAB — URINALYSIS COMPLETE WITH MICROSCOPIC (ARMC ONLY)
Bilirubin Urine: NEGATIVE
GLUCOSE, UA: NEGATIVE mg/dL
Hgb urine dipstick: NEGATIVE
Ketones, ur: NEGATIVE mg/dL
Nitrite: NEGATIVE
PROTEIN: NEGATIVE mg/dL
SPECIFIC GRAVITY, URINE: 1.011 (ref 1.005–1.030)
pH: 7 (ref 5.0–8.0)

## 2015-12-04 LAB — WET PREP, GENITAL
SPERM: NONE SEEN
TRICH WET PREP: NONE SEEN
Yeast Wet Prep HPF POC: NONE SEEN

## 2015-12-04 LAB — GLUCOSE, CAPILLARY: Glucose-Capillary: 93 mg/dL (ref 65–99)

## 2015-12-04 LAB — POCT PREGNANCY, URINE: Preg Test, Ur: NEGATIVE

## 2015-12-04 MED ORDER — METRONIDAZOLE 500 MG PO TABS: 500.0000 mg | ORAL_TABLET | Freq: Two times a day (BID) | ORAL | 0 refills | Status: DC

## 2015-12-04 NOTE — ED Triage Notes (Signed)
Pt to ed with c/o sore throat and vaginal burning and pain after having unprotected intercourse with boyfriend.

## 2015-12-04 NOTE — ED Notes (Signed)
Pt states understanding of discharge instructions. NAD noted at this time.  

## 2015-12-04 NOTE — Discharge Instructions (Signed)
Follow-up with your  primary care doctor or [facility] if any continued problems. Take Flagyl as directed. Be aware you cannot drink alcohol while taking this medication.

## 2015-12-04 NOTE — ED Provider Notes (Signed)
[facility] Emergency Department Provider Note   ____________________________________________   First MD Initiated Contact with Patient 12/04/15 1048     (approximate)  I have reviewed the triage vital signs and the nursing notes.   HISTORY  Chief Complaint Sore Throat and Vaginal Pain    HPI Ashley Olson is a 30 y.o. female is here with  complaint sore throat and also vaginal burning after having unprotected sex with her boyfriend of one year. Patient denies any vaginal discharge. She states they also had oral sex on Sunday. She states that her partner is not having any symptoms currently.Patient states she has not ever been diagnosed with any STD but she is extremely worried about it now. Patient states that she frequently gets yeast infections and her family has a history of diabetes. She herself has not been tested for diabetes in the last 2-3 years. Patient had something to drink this morning and a tootie roll.  She rates her pain as 4 out of 10.   Past Medical History:  Diagnosis Date  . Sciatic pain     There are no active problems to display for this patient.   Past Surgical History:  Procedure Laterality Date  . APPENDECTOMY      Prior to Admission medications   Medication Sig Start Date End Date Taking? Authorizing Provider  metroNIDAZOLE (FLAGYL) 500 MG tablet Take 1 tablet (500 mg total) by mouth 2 (two) times daily. 12/04/15   Tommi Rumpshonda L Moncerrath Berhe, PA-C    Allergies Amoxicillin  History reviewed. No pertinent family history.  Social History Social History  Substance Use Topics  . Smoking status: Current Some Day Smoker  . Smokeless tobacco: Never Used  . Alcohol use No    Review of Systems Constitutional: No fever/chills Eyes: No visual changes. ENT: Positive sore throat. Cardiovascular: Denies chest pain. Respiratory: Denies shortness of breath. Gastrointestinal: No abdominal pain.  No nausea, no vomiting.     Genitourinary: Negative for dysuria. Positive for vaginal itching. Negative for vaginal discharge. Musculoskeletal: Negative for back pain. Skin: Negative for rash. Neurological: Negative for headaches, focal weakness or numbness.  10-point ROS otherwise negative.  ____________________________________________   PHYSICAL EXAM:  VITAL SIGNS: ED Triage Vitals [12/04/15 0958]  Enc Vitals Group     BP (!) 148/97     Pulse Rate 79     Resp 18     Temp 98.4 F (36.9 C)     Temp Source Oral     SpO2 100 %     Weight (!) 385 lb (174.6 kg)     Height      Head Circumference      Peak Flow      Pain Score 4     Pain Loc      Pain Edu?      Excl. in GC?     Constitutional: Alert and oriented. Well appearing and in no acute distress.Morbidly obese. Eyes: Conjunctivae are normal. PERRL. EOMI. Head: Atraumatic. Nose: No congestion/rhinnorhea. Mouth/Throat: Mucous membranes are moist.  Oropharynx non-erythematous.And no exudate or drainage was noted. Neck: No stridor.   Hematological/Lymphatic/Immunilogical: No cervical lymphadenopathy. Cardiovascular: Normal rate, regular rhythm. Grossly normal heart sounds.  Good peripheral circulation. Respiratory: Normal respiratory effort.  No retractions. Lungs CTAB. Gastrointestinal: Soft and nontender. No distention.  Genitourinary:  Pelvic exam there is no obvious discharge present. Cultures were obtained. No adnexal masses or tenderness was noted. Chandelier sign was negative. Musculoskeletal:  Moves upper and lower  extremities without difficulty. Neurologic:  Normal speech and language. No gross focal neurologic deficits are appreciated. No gait instability. Skin:  Skin is warm, dry and intact. No rash noted. Psychiatric: Mood and affect are normal. Speech and behavior are normal.  ____________________________________________   LABS (all labs ordered are listed, but only abnormal results are displayed)  Labs Reviewed  WET PREP,  GENITAL - Abnormal; Notable for the following:       Result Value   Clue Cells Wet Prep HPF POC PRESENT (*)    WBC, Wet Prep HPF POC MODERATE (*)    All other components within normal limits  URINALYSIS COMPLETEWITH MICROSCOPIC ([facility] ONLY) - Abnormal; Notable for the following:    Color, Urine YELLOW (*)    APPearance HAZY (*)    Leukocytes, UA 2+ (*)    Bacteria, UA RARE (*)    Squamous Epithelial / LPF 6-30 (*)    All other components within normal limits  CHLAMYDIA/NGC RT PCR ([facility] ONLY)  CHLAMYDIA/NGC RT PCR ([facility] ONLY)  GLUCOSE, CAPILLARY  POC URINE PREG, ED  CBG MONITORING, ED  POCT PREGNANCY, URINE    PROCEDURES  Procedure(s) performed: None  Procedures  Critical Care performed: No  ____________________________________________   INITIAL IMPRESSION / ASSESSMENT AND PLAN / ED COURSE  Pertinent labs & imaging results that were available during my care of the patient were reviewed by me and considered in my medical decision making (see chart for details).    Clinical Course  Nonfasting blood sugar was 93 and patient was reassured that this is not elevated. We talked about bacterial vaginosis and it symptoms. Patient is aware that her gonorrhea and chlamydia test is being processed and that should there be a positive she will be called. Currently we are going to treat for the bacterial vaginosis with Flagyl. She is aware that she cannot drink alcohol while taking this medication and for 72 hours after finishing the entire course. Patient is follow-up with [facility] if any continued problems.   ____________________________________________   FINAL CLINICAL IMPRESSION(S) / ED DIAGNOSES  Final diagnoses:  BV (bacterial vaginosis)      NEW MEDICATIONS STARTED DURING THIS VISIT:  Discharge Medication List as of 12/04/2015 12:36 PM    START taking these medications   Details  metroNIDAZOLE (FLAGYL) 500 MG tablet Take 1  tablet (500 mg total) by mouth 2 (two) times daily., Starting Sat 12/04/2015, Print         Note:  This document was prepared using Dragon voice recognition software and may include unintentional dictation errors.    Tommi Rumpshonda L Madlynn Lundeen, PA-C 12/04/15 1255    Jeanmarie PlantJames A McShane, MD 12/04/15 [phone]

## 2015-12-04 NOTE — ED Notes (Signed)
Urine POCT was negative. CBG was 93

## 2015-12-04 NOTE — ED Notes (Signed)
See triage note   States she developed some vaginal burning after having sex with b/f  Denies any vaginal discharge   But also had oral sex same day..Marland Kitchen

## 2015-12-18 ENCOUNTER — Emergency Department
Admission: EM | Admit: 2015-12-18 | Discharge: 2015-12-18 | Disposition: A | Discharge status: Home / Self Care | Payer: 59 | Attending: Emergency Medicine | Admitting: Emergency Medicine

## 2015-12-18 ENCOUNTER — Encounter: Payer: Self-pay | Admitting: Urgent Care

## 2015-12-18 DIAGNOSIS — K120 Recurrent oral aphthae: Secondary | ICD-10-CM | POA: Insufficient documentation

## 2015-12-18 DIAGNOSIS — N3090 Cystitis, unspecified without hematuria: Secondary | ICD-10-CM | POA: Diagnosis not present

## 2015-12-18 DIAGNOSIS — F17200 Nicotine dependence, unspecified, uncomplicated: Secondary | ICD-10-CM | POA: Insufficient documentation

## 2015-12-18 DIAGNOSIS — R300 Dysuria: Secondary | ICD-10-CM | POA: Diagnosis present

## 2015-12-18 LAB — URINALYSIS COMPLETE WITH MICROSCOPIC (ARMC ONLY)
Bilirubin Urine: NEGATIVE
Glucose, UA: NEGATIVE mg/dL
Ketones, ur: NEGATIVE mg/dL
NITRITE: NEGATIVE
PROTEIN: 100 mg/dL — AB
SPECIFIC GRAVITY, URINE: 1.023 (ref 1.005–1.030)
pH: 6 (ref 5.0–8.0)

## 2015-12-18 LAB — POCT PREGNANCY, URINE: PREG TEST UR: NEGATIVE

## 2015-12-18 MED ORDER — MAGIC MOUTHWASH
5.0000 mL | Freq: Four times a day (QID) | ORAL | 0 refills | Status: DC
Start: 2015-12-18 — End: 2016-04-18

## 2015-12-18 MED ORDER — SULFAMETHOXAZOLE-TRIMETHOPRIM 800-160 MG PO TABS
1.0000 | ORAL_TABLET | Freq: Once | ORAL | Status: AC
  Administered 2015-12-18: 1 via ORAL
  Filled 2015-12-18: qty 1

## 2015-12-18 MED ORDER — SULFAMETHOXAZOLE-TRIMETHOPRIM 800-160 MG PO TABS: 1.0000 | ORAL_TABLET | Freq: Two times a day (BID) | ORAL | 0 refills | Status: DC

## 2015-12-18 NOTE — ED Triage Notes (Signed)
Patient presents with c/o oral lesions that declared earlier today. Patient also with dysuria; denies vaginal discharge. Denies N/V and fever.

## 2015-12-18 NOTE — ED Provider Notes (Signed)
[facility] Emergency Department Provider Note  ____________________________________________   First MD Initiated Contact with Patient 12/18/15 2205     (approximate)  I have reviewed the triage vital signs and the nursing notes.   HISTORY  Chief Complaint Mouth Lesions and Dysuria   HPI Ashley Olson is a 30 y.o. female is here complaining of oral lesions on the right side of her tongue along with dysuria that began today. Patient denies any nausea, vomiting, fever or chills. Patient states that she has had a history of urinary tract infections in the past but not one in a long time. The lesions on her tongue has been painful and is made difficult for her to eat. Patient continues to drink fluids without any difficulty. Currently she rates her pain as 5/10.   Past Medical History:  Diagnosis Date  . Sciatic pain     There are no active problems to display for this patient.   Past Surgical History:  Procedure Laterality Date  . APPENDECTOMY      Prior to Admission medications   Medication Sig Start Date End Date Taking? Authorizing Provider  magic mouthwash SOLN Take 5 mLs by mouth 4 (four) times daily. 12/18/15   Tommi Rumpshonda L Anders Hohmann, PA-C  metroNIDAZOLE (FLAGYL) 500 MG tablet Take 1 tablet (500 mg total) by mouth 2 (two) times daily. 12/04/15   Tommi Rumpshonda L Ryann Leavitt, PA-C  sulfamethoxazole-trimethoprim (BACTRIM DS,SEPTRA DS) 800-160 MG tablet Take 1 tablet by mouth 2 (two) times daily. 12/18/15   Tommi Rumpshonda L Talaysha Freeberg, PA-C    Allergies Amoxicillin  No family history on file.  Social History Social History  Substance Use Topics  . Smoking status: Current Some Day Smoker  . Smokeless tobacco: Never Used  . Alcohol use No    Review of Systems Constitutional: No fever/chills Eyes: No visual changes. ENT: Oral lesions positive. Cardiovascular: Denies chest pain. Respiratory: Denies shortness of breath. Gastrointestinal:  No nausea, no  vomiting.   Genitourinary: Positive for dysuria. Musculoskeletal: Negative for back pain. Skin: Negative for rash. Neurological: Negative for headaches, focal weakness or numbness.  10-point ROS otherwise negative.  ____________________________________________   PHYSICAL EXAM:  VITAL SIGNS: ED Triage Vitals  Enc Vitals Group     BP 12/18/15 2146 (!) 147/86     Pulse Rate 12/18/15 2146 80     Resp 12/18/15 2146 18     Temp 12/18/15 2146 98 F (36.7 C)     Temp Source 12/18/15 2146 Oral     SpO2 12/18/15 2146 100 %     Weight 12/18/15 2147 (!) 356 lb (161.5 kg)     Height 12/18/15 2147 6' (1.829 m)     Head Circumference --      Peak Flow --      Pain Score 12/18/15 2151 5     Pain Loc --      Pain Edu? --      Excl. in GC? --     Constitutional: Alert and oriented. Well appearing and in no acute distress. Eyes: Conjunctivae are normal. PERRL. EOMI. Head: Atraumatic. Nose: No congestion/rhinnorhea. Mouth/Throat: Mucous membranes are moist.  Oropharynx non-erythematous. Right lateral tongue there are very small ulcerative lesion 2. No other oral lesions were seen. There is no drainage or swelling noted. Neck: No stridor.   Hematological/Lymphatic/Immunilogical: No cervical lymphadenopathy. Cardiovascular: Normal rate, regular rhythm. Grossly normal heart sounds.  Good peripheral circulation. Respiratory: Normal respiratory effort.  No retractions. Lungs CTAB. Gastrointestinal: Soft and nontender.  No distention. Marland Kitchen. No CVA tenderness. Musculoskeletal: Moves upper and lower extremities without any difficulty. Normal gait was noted. Neurologic:  Normal speech and language. No gross focal neurologic deficits are appreciated. No gait instability. Skin:  Skin is warm, dry and intact. No rash noted. Psychiatric: Mood and affect are normal. Speech and behavior are normal.  ____________________________________________   LABS (all labs ordered are listed, but only abnormal  results are displayed)  Labs Reviewed  URINALYSIS COMPLETEWITH MICROSCOPIC ([facility] ONLY) - Abnormal; Notable for the following:       Result Value   Color, Urine YELLOW (*)    APPearance CLOUDY (*)    Hgb urine dipstick 2+ (*)    Protein, ur 100 (*)    Leukocytes, UA 3+ (*)    Bacteria, UA RARE (*)    Squamous Epithelial / LPF 6-30 (*)    All other components within normal limits  POC URINE PREG, ED  POCT PREGNANCY, URINE    PROCEDURES  Procedure(s) performed: None  Procedures  Critical Care performed: No  ____________________________________________   INITIAL IMPRESSION / ASSESSMENT AND PLAN / ED COURSE  Pertinent labs & imaging results that were available during my care of the patient were reviewed by me and considered in my medical decision making (see chart for details).    Clinical Course    Patient was given Septra DS while in the emergency room to begin her antibiotic therapy since all pharmacies are closed at this hour. Patient was given prescription for Magic mouthwash to be used and to avoid foods or beverages that may increase pain. Patient is to increase fluids. She is follow-up with her primary care doctor or [facility] if any continued problems.  ____________________________________________   FINAL CLINICAL IMPRESSION(S) / ED DIAGNOSES  Final diagnoses:  Cystitis  Oral aphthous ulcer      NEW MEDICATIONS STARTED DURING THIS VISIT:  New Prescriptions   MAGIC MOUTHWASH SOLN    Take 5 mLs by mouth 4 (four) times daily.   SULFAMETHOXAZOLE-TRIMETHOPRIM (BACTRIM DS,SEPTRA DS) 800-160 MG TABLET    Take 1 tablet by mouth 2 (two) times daily.     Note:  This document was prepared using Dragon voice recognition software and may include unintentional dictation errors.    Tommi RumpsRhonda L Haven Foss, PA-C 12/18/15 [id number]    Emily FilbertJonathan E Williams, MD 12/19/15 [phone]

## 2015-12-18 NOTE — Discharge Instructions (Signed)
Follow-up with your primary care doctor or [facility] if any continued problems. Take all the antibiotics until completely finished. Increase fluids. Use Magic mouthwash before meals and at bedtime. Avoid foods and beverages that you know will increase discomfort in your mouth. No foods or juices that contains citric acid. Avoid salty foods or any foods that are difficult to eat.

## 2016-03-16 ENCOUNTER — Encounter: Payer: Self-pay | Admitting: Emergency Medicine

## 2016-03-16 ENCOUNTER — Emergency Department
Admission: EM | Admit: 2016-03-16 | Discharge: 2016-03-16 | Disposition: A | Discharge status: Home / Self Care | Payer: 59 | Attending: Emergency Medicine | Admitting: Emergency Medicine

## 2016-03-16 DIAGNOSIS — B9689 Other specified bacterial agents as the cause of diseases classified elsewhere: Secondary | ICD-10-CM

## 2016-03-16 DIAGNOSIS — N760 Acute vaginitis: Secondary | ICD-10-CM | POA: Insufficient documentation

## 2016-03-16 DIAGNOSIS — F17210 Nicotine dependence, cigarettes, uncomplicated: Secondary | ICD-10-CM | POA: Insufficient documentation

## 2016-03-16 DIAGNOSIS — R102 Pelvic and perineal pain: Secondary | ICD-10-CM | POA: Diagnosis present

## 2016-03-16 LAB — URINALYSIS, COMPLETE (UACMP) WITH MICROSCOPIC
BILIRUBIN URINE: NEGATIVE
Bacteria, UA: NONE SEEN
Glucose, UA: NEGATIVE mg/dL
KETONES UR: 5 mg/dL — AB
Nitrite: NEGATIVE
PH: 6 (ref 5.0–8.0)
PROTEIN: NEGATIVE mg/dL
Specific Gravity, Urine: 1.016 (ref 1.005–1.030)

## 2016-03-16 LAB — WET PREP, GENITAL
SPERM: NONE SEEN
Trich, Wet Prep: NONE SEEN
YEAST WET PREP: NONE SEEN

## 2016-03-16 LAB — PREGNANCY, URINE: Preg Test, Ur: NEGATIVE

## 2016-03-16 LAB — CHLAMYDIA/NGC RT PCR (ARMC ONLY)
Chlamydia Tr: NOT DETECTED
N GONORRHOEAE: NOT DETECTED

## 2016-03-16 MED ORDER — KETOROLAC TROMETHAMINE 30 MG/ML IJ SOLN
30.0000 mg | Freq: Once | INTRAMUSCULAR | Status: AC
  Administered 2016-03-16: 30 mg via INTRAMUSCULAR
  Filled 2016-03-16: qty 1

## 2016-03-16 MED ORDER — METRONIDAZOLE 500 MG PO TABS: 500.0000 mg | ORAL_TABLET | Freq: Two times a day (BID) | ORAL | 0 refills | Status: AC

## 2016-03-16 NOTE — ED Triage Notes (Signed)
Patient presents to the ED with pelvic pain x 2 days and sore throat/sore tongue today.  Patient reports having similar in January when her Mirena IUD changed positions and had to be taken out.  It was then replaced in January.  Patient has no memory of biting or burning her tongue.  Patient states, "It kind of feels like a canker sore on there."  Patient is in no obvious distress at this time.

## 2016-03-16 NOTE — ED Provider Notes (Addendum)
[facility] Emergency Department Provider Note  ____________________________________________  Time seen: Approximately 8:04 PM  I have reviewed the triage vital signs and the nursing notes.   HISTORY  Chief Complaint Pelvic Pain and Sore Throat   HPI Ashley FishermanKendra D Olson is a 31 y.o. female who presents for evaluation of the abdominal pain. Patient reports that one month ago she had a new Mirena IUD placed because her old one had moved to an abnormal position in her uterus. Yesterday she started having lower abdominal pain that she describes as shock like pain located in her suprapubic region and vaginal canal. She also started to have small amount of vaginal bleeding yesterday. She is afraid that her Mirena might have moved again. She called her OB who told her to wait until the morningbut if the pain was too bad to come to the ED. She reports that her pain is cramping, severe, located in the suprapubic region, constant since yesterday. She has not tried anything at home for the pain. She is concerned about STDs. She denies vaginal discharge or dysuria. She is also complaining of pain in the tip of her tongue and sore throat that she is concerned it might be herpes.  Past Medical History:  Diagnosis Date  . Sciatic pain     There are no active problems to display for this patient.   Past Surgical History:  Procedure Laterality Date  . APPENDECTOMY      Prior to Admission medications   Medication Sig Start Date End Date Taking? Authorizing Provider  magic mouthwash SOLN Take 5 mLs by mouth 4 (four) times daily. 12/18/15   Tommi Rumpshonda L Summers, PA-C  metroNIDAZOLE (FLAGYL) 500 MG tablet Take 1 tablet (500 mg total) by mouth 2 (two) times daily. 03/16/16 03/23/16  Nita Sicklearolina Gaylene Moylan, MD  sulfamethoxazole-trimethoprim (BACTRIM DS,SEPTRA DS) 800-160 MG tablet Take 1 tablet by mouth 2 (two) times daily. 12/18/15   Tommi Rumpshonda L Summers, PA-C    Allergies Amoxicillin  No  family history on file.  Social History Social History  Substance Use Topics  . Smoking status: Current Some Day Smoker    Packs/day: 0.20    Types: Cigarettes  . Smokeless tobacco: Never Used  . Alcohol use No    Review of Systems  Constitutional: Negative for fever. Eyes: Negative for visual changes. ENT: + sore throat. Neck: No neck pain  Cardiovascular: Negative for chest pain. Respiratory: Negative for shortness of breath. Gastrointestinal: + suprapubic abdominal pain. No vomiting or diarrhea. Genitourinary: Negative for dysuria. Musculoskeletal: Negative for back pain. Skin: Negative for rash. Neurological: Negative for headaches, weakness or numbness. Psych: No SI or HI  ____________________________________________   PHYSICAL EXAM:  VITAL SIGNS: ED Triage Vitals  Enc Vitals Group     BP 03/16/16 1806 (!) 146/60     Pulse Rate 03/16/16 1806 98     Resp 03/16/16 1806 16     Temp 03/16/16 1806 98.5 F (36.9 C)     Temp Source 03/16/16 1806 Oral     SpO2 03/16/16 1806 97 %     Weight 03/16/16 1807 (!) 400 lb (181.4 kg)     Height 03/16/16 1807 6' (1.829 m)     Head Circumference --      Peak Flow --      Pain Score 03/16/16 1821 10     Pain Loc --      Pain Edu? --      Excl. in GC? --  Constitutional: Alert and oriented. Well appearing and in no apparent distress. HEENT:      Head: Normocephalic and atraumatic.         Eyes: Conjunctivae are normal. Sclera is non-icteric. EOMI. PERRL      Mouth/Throat: Mucous membranes are moist. Aphthous ulcer seen on the tip of the tongue. Oropharynx is clear      Neck: Supple with no signs of meningismus. Cardiovascular: Regular rate and rhythm. No murmurs, gallops, or rubs. 2+ symmetrical distal pulses are present in all extremities. No JVD. Respiratory: Normal respiratory effort. Lungs are clear to auscultation bilaterally. No wheezes, crackles, or rhonchi.  Gastrointestinal: Soft, non tender, and non distended  with positive bowel sounds. No rebound or guarding. Genitourinary: No CVA tenderness. Pelvic exam: Normal external genitalia, no rashes or lesions. Patient with pain inside her vaginal canal during exam, has irritated mucosa and small amount of discharge. Mirena string can be seen coming out of the os. Os closed. No cervical motion tenderness.  No uterine or adnexal tenderness.   Musculoskeletal: Nontender with normal range of motion in all extremities. No edema, cyanosis, or erythema of extremities. Neurologic: Normal speech and language. Face is symmetric. Moving all extremities. No gross focal neurologic deficits are appreciated. Skin: Skin is warm, dry and intact. No rash noted. Psychiatric: Mood and affect are normal. Speech and behavior are normal.  ____________________________________________   LABS (all labs ordered are listed, but only abnormal results are displayed)  Labs Reviewed  WET PREP, GENITAL - Abnormal; Notable for the following:       Result Value   Clue Cells Wet Prep HPF POC PRESENT (*)    WBC, Wet Prep HPF POC MODERATE (*)    All other components within normal limits  URINALYSIS, COMPLETE (UACMP) WITH MICROSCOPIC - Abnormal; Notable for the following:    Color, Urine YELLOW (*)    APPearance CLEAR (*)    Hgb urine dipstick MODERATE (*)    Ketones, ur 5 (*)    Leukocytes, UA SMALL (*)    Squamous Epithelial / LPF 6-30 (*)    All other components within normal limits  CHLAMYDIA/NGC RT PCR ([facility] ONLY)  PREGNANCY, URINE   ____________________________________________  EKG  none ____________________________________________  RADIOLOGY  none  ____________________________________________   PROCEDURES  Procedure(s) performed: None Procedures Critical Care performed:  None ____________________________________________   INITIAL IMPRESSION / ASSESSMENT AND PLAN / ED COURSE   31 y.o. female who presents for evaluation of suprapubic abdominal pain  since yesterday and concerns that her Mirena is out of place. Pelvic exam showing irritation opf vaginal mucosa and small amount of discharge. No CMT or adnexal ttp. String of Mirena coming out of the os. Wet prep, GC/ chlamydia pending. UA with no UTI. Pregnancy pending. Will give toraodl for pain.  Clinical Course as of Mar 17 2235  Thu Mar 16, 2016  2109 Wet prep positive for BV. Patient be discharged home on Flagyl. Chlamydia and gonorrhea are pending we'll call her with the results.  [CV]    Clinical Course User Index [CV] Nita Sickle, MD    Pertinent labs & imaging results that were available during my care of the patient were reviewed by me and considered in my medical decision making (see chart for details).    ____________________________________________   FINAL CLINICAL IMPRESSION(S) / ED DIAGNOSES  Final diagnoses:  BV (bacterial vaginosis)      NEW MEDICATIONS STARTED DURING THIS VISIT:  Discharge Medication List as of 03/16/2016  9:05 PM       Note:  This document was prepared using Dragon voice recognition software and may include unintentional dictation errors.    Nita Sickle, MD 03/16/16 2110    Nita Sickle, MD 03/16/16 2237

## 2016-04-18 ENCOUNTER — Encounter: Payer: Self-pay | Admitting: Family Medicine

## 2016-04-18 ENCOUNTER — Ambulatory Visit (INDEPENDENT_AMBULATORY_CARE_PROVIDER_SITE_OTHER): Payer: 59 | Admitting: Family Medicine

## 2016-04-18 VITALS — BP 134/84 | HR 76 | Temp 98.8°F | Resp 16 | Ht 72.0 in | Wt 372.6 lb

## 2016-04-18 DIAGNOSIS — R7309 Other abnormal glucose: Secondary | ICD-10-CM | POA: Diagnosis not present

## 2016-04-18 DIAGNOSIS — R5383 Other fatigue: Secondary | ICD-10-CM | POA: Diagnosis not present

## 2016-04-18 DIAGNOSIS — F339 Major depressive disorder, recurrent, unspecified: Secondary | ICD-10-CM | POA: Diagnosis not present

## 2016-04-18 DIAGNOSIS — F5105 Insomnia due to other mental disorder: Secondary | ICD-10-CM

## 2016-04-18 DIAGNOSIS — R030 Elevated blood-pressure reading, without diagnosis of hypertension: Secondary | ICD-10-CM | POA: Insufficient documentation

## 2016-04-18 DIAGNOSIS — G4700 Insomnia, unspecified: Secondary | ICD-10-CM | POA: Insufficient documentation

## 2016-04-18 DIAGNOSIS — F329 Major depressive disorder, single episode, unspecified: Secondary | ICD-10-CM | POA: Diagnosis not present

## 2016-04-18 DIAGNOSIS — F99 Mental disorder, not otherwise specified: Secondary | ICD-10-CM

## 2016-04-18 DIAGNOSIS — D509 Iron deficiency anemia, unspecified: Secondary | ICD-10-CM

## 2016-04-18 DIAGNOSIS — K121 Other forms of stomatitis: Secondary | ICD-10-CM

## 2016-04-18 DIAGNOSIS — F411 Generalized anxiety disorder: Secondary | ICD-10-CM | POA: Diagnosis not present

## 2016-04-18 DIAGNOSIS — F419 Anxiety disorder, unspecified: Secondary | ICD-10-CM | POA: Insufficient documentation

## 2016-04-18 DIAGNOSIS — F32A Depression, unspecified: Secondary | ICD-10-CM

## 2016-04-18 MED ORDER — ESCITALOPRAM OXALATE 10 MG PO TABS: 10.0000 mg | ORAL_TABLET | Freq: Every day | ORAL | 2 refills | Status: DC

## 2016-04-18 NOTE — Assessment & Plan Note (Signed)
Chronic problem, secondary to uncontrolled depression/anxiety, mostly limited to sleep onset, not maintenance - Trial on SSRI Lexapro 10mg  daily, can titrate up if needed - Future consider additional med adjust SSRI vs Trazodone or other meds

## 2016-04-18 NOTE — Assessment & Plan Note (Addendum)
Mild elevated BP initial reading, repeat manual improved, still in pre-HTN range concerning with prior history of some mild elevated BPs. Family history of HTN and concern given morbid obesity BMI >50, at risk for progression to HTN especially concern at young age 31  Plan: 1. No new dx today. No therapy 2. Start monitoring BP outside office, write down readings, if persistent >140/90 can notify office sooner, otherwise bring record to next visit 3. Encouraged future lifestyle with diet and exercise to help control BP 4. Follow-up 4 weeks re-check BP

## 2016-04-18 NOTE — Assessment & Plan Note (Addendum)
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 Complicated by significant anxiety with panic, and secondary insomnia. Gradual worsening mental health >8 years multiple major life stressors with dx special needs child autism and divorce husband, financial stress single parent. - PHQ9: 3519 - Difficult functioning, but still able to keep job and family together - No prior treatments med or counseling  Plan: 1. Discussion today on new dx depression, prognosis, management including SSRI and other medications along with counseling / therapy 2. Start Escitalopram 10mg  daily - 4-6 weeks to more effect, reviewed potential side effects including GI intolerance, potential insomnia, and reviewed black box warning on increased suicidal ideation, however patient unlikely risk given no prior history of this. Goal to help improve anxiety and insomnia 3. Handout given on recommended local psychology therapist, self-referral in addition to med therapy 4. Follow-up 4 weeks - review PHQ9 progress, med adjust, may titrate up to 20mg , also discussed alternative SSRI options such as Zoloft  Check labs today as well with iron studies, Vit D, TSH also for fatigue, wt gain

## 2016-04-18 NOTE — Assessment & Plan Note (Signed)
Recent wt loss by report 40-50 lbs over few years with lifestyle changes, recent wts seem to fluctate - No current known complication. Prior abnormal glucose with A1c 5.6 in past, no dx Pre-DM or DM, also prior elevated BP but no dx HTN, limited outside readings done. - Check labs today - Follow-up 4 weeks, in future monitor wt discuss further lifestyle modifications, consider [facility], other wt management options

## 2016-04-18 NOTE — Patient Instructions (Signed)
Thank you for coming in to clinic today.  1. BP is mildly elevated today, improved on re-check, not consistent with HTN - Check BP outside office few times a week, write down on log, bring to next visit    As discussed, it sounds like your symptoms are primarily related to anxiety / adjustment disorder. This is a very common problem and be related to several factors, including life stressors. Start treatment with Escitalopram 10mg , take one tablet daily for next 4 weeks. As discussed most anxiety medications are also used for mood disorders such as depression, because they work on similar chemicals in your brain. It may take up to 3-4 weeks for the medicine to take full effect and for you to notice a difference, sometimes you may notice it working sooner, otherwise we may need to adjust the dose.  For most patients with anxiety or mood concerns, we generally recommend referral to establish with a therapist or counselor as well. This has been shown to improve the effectiveness of the medications, and in the future we may be able to taper off medications ---------------------------------------------  For oral / tongue burning / symptoms - can start OTC Orajel / Peroxyl - "oral debridement" mouth wash, 1-2 times daily for a week or so, follow instructions on bottle, if not improving, may try the previous rx Magic Mouthwash, to see if this helps it heal - Otherwise next step is Dentist  I do not see any obvious abnormality today  ----------------------------------------------  4-7-8 breathing technique as needed: breathe in to count of 4, hold breath for count of 7, exhale for count of 8; do 3-5 times for letting go of overactive thoughts  ------------------------------------------------ Psychology Counseling ONLY  Self Referral:  1. [facility]   Address: [address], [place], [place] [address] Hours: Open today  9AM-7PM Phone: [phone]  2. Anell Barrheryl  [address], [facility]  - [facility] Address: [address], [place], [place] [address] Phone: [phone]   [facility] + [facility]SELING  Self Referral [facility]al [facility]) [place] [address], [place], [place] [address] Phone: [phone]  Please schedule a follow-up appointment with Dr. Althea CharonKaramalegos in 4 weeks for Annual Physical / Review Labs / Anxiety/Depression PHQ/GAD  If you have any other questions or concerns, please feel free to call the clinic or send a message through MyChart. You may also schedule an earlier appointment if necessary.  Saralyn PilarAlexander Leiah Giannotti, DO [facility] [place] [facility]

## 2016-04-18 NOTE — Assessment & Plan Note (Signed)
Moderate to severe anxiety, likely generalized anxiety disorder (GAD) based on variety of life stressors and causing persistent symptoms affecting her function. Associated with agitation / irritability with her anxiety and panic symptoms. Complicated by major depression and secondary insomnia. - Gradual worsening mental health >8 years multiple major life stressors with dx special needs child autism and divorce husband, financial stress single parent. - GAD7: 20, very difficult functioning, but still able to keep job and family together - No prior treatments med or counseling  Plan: 1. Discussion today on new dx anxiety, prognosis, management including SSRI and other medications along with counseling / therapy 2. Start Escitalopram 10mg  daily - 4-6 weeks to more effect, reviewed potential side effects including GI intolerance, potential insomnia, and reviewed black box warning on increased suicidal ideation, however patient unlikely risk given no prior history of this. Goal to help improve anxiety and insomnia 3. Handout given on recommended local psychology therapist, self-referral in addition to med therapy 4. Follow-up 4 weeks - review GAD7 progress, med adjust, may titrate up to 20mg , also discussed alternative SSRI options such as Zoloft

## 2016-04-18 NOTE — Assessment & Plan Note (Signed)
Prior chronic history of anemia, last CBC 2016 with low Hgb baseline 10-11 with MCV 70s, worse with prior pregnancy. Has some chronic symptoms with cold intolerance and fatigue. - Check CBC, iron studies, ferritin - Also check TSH, Vit D
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
## 2016-05-23 ENCOUNTER — Encounter: Payer: Self-pay | Admitting: Family Medicine

## 2016-05-23 ENCOUNTER — Ambulatory Visit (INDEPENDENT_AMBULATORY_CARE_PROVIDER_SITE_OTHER): Payer: 59 | Admitting: Family Medicine

## 2016-05-23 VITALS — BP 129/82 | HR 78 | Temp 98.3°F | Resp 16 | Ht 72.0 in | Wt 372.0 lb

## 2016-05-23 DIAGNOSIS — F5105 Insomnia due to other mental disorder: Secondary | ICD-10-CM | POA: Diagnosis not present

## 2016-05-23 DIAGNOSIS — M5136 Other intervertebral disc degeneration, lumbar region: Secondary | ICD-10-CM | POA: Diagnosis not present

## 2016-05-23 DIAGNOSIS — F411 Generalized anxiety disorder: Secondary | ICD-10-CM

## 2016-05-23 DIAGNOSIS — F99 Mental disorder, not otherwise specified: Secondary | ICD-10-CM | POA: Diagnosis not present

## 2016-05-23 DIAGNOSIS — M5442 Lumbago with sciatica, left side: Secondary | ICD-10-CM

## 2016-05-23 DIAGNOSIS — F339 Major depressive disorder, recurrent, unspecified: Secondary | ICD-10-CM

## 2016-05-23 DIAGNOSIS — G8929 Other chronic pain: Secondary | ICD-10-CM | POA: Diagnosis not present

## 2016-05-23 MED ORDER — ESCITALOPRAM OXALATE 20 MG PO TABS: 20.0000 mg | ORAL_TABLET | Freq: Every day | ORAL | 5 refills | Status: DC

## 2016-05-23 MED ORDER — MELOXICAM 15 MG PO TABS: 15.0000 mg | ORAL_TABLET | Freq: Every day | ORAL | 2 refills | Status: DC

## 2016-05-23 MED ORDER — CYCLOBENZAPRINE HCL 10 MG PO TABS: 10.0000 mg | ORAL_TABLET | Freq: Two times a day (BID) | ORAL | 2 refills | Status: DC | PRN

## 2016-05-23 NOTE — Patient Instructions (Signed)
Thank you for coming in to clinic today.  Increased Escitalopram from 10 to  daily - this will take 4-6 weeks to reach full effect again, crying spells may be related, I do think this is important to help control your symptoms  1. For your Back Pain - I think that this is due to Muscle Spasms or strain. Your Sciatic Nerve can be affected causing some of your radiation and numbness down your legs. 2. Start with anti-inflammatory Meloxicam  daily with food every day for next 2 to 4 weeks if helping, then can use only as needed 3. Start Cyclobenzapine (Flexeril)  tablets - take as needed either  3 times a day or  twice a day, may make you sedated or sleepy (be careful driving or working on this) if tolerated you can take every 8 hours, half or whole tab  Recommend to start taking Tylenol Extra Strength  tabs - take 1 to 2 tabs per dose (max ) every 6-8 hours for pain (take regularly, don't skip a dose for next 7 days), max 24 hour daily dose is 6 tablets or . In the future you can repeat the same everyday Tylenol course for 1-2 weeks at a time. - This is safe to take with anti-inflammatory medicines (Ibuprofen, Advil, Naproxen, Aleve, Meloxicam, Mobic)  Recommend to start using heating pad on your lower back 1-2x daily for few weeks  This pain may take weeks to months to fully resolve, but hopefully it will respond to the medicine initially. All back injuries (small or serious) are slow to heal since we use our back muscles every day. Be careful with turning, twisting, lifting, sitting / standing for prolonged periods, and avoid re-injury.  If your symptoms significantly worsen with more pain, or new symptoms with weakness in one or both legs, new or different shooting leg pains, numbness in legs or groin, loss of control or retention of urine or bowel movements, please call back for advice and you may need to go directly to the Emergency Department.  Please  schedule a follow-up appointment with Dr. Althea Charon in 4-6 weeks for Annual Physical, lab results, GAD Anxiety  If you have any other questions or concerns, please feel free to call the clinic or send a message through MyChart. You may also schedule an earlier appointment if necessary.  Ashley Pilar, DO [facility], [place]

## 2016-05-23 NOTE — Assessment & Plan Note (Signed)
Stable to minimally improved moderate to severe generalized anxiety with panic. Now on SSRI x 4 weeks. - Complicated by recurrent mod/severe MDD, and secondary insomnia. - GAD7: 20 (very difficult) > 18 (somewhat difficult) - Slightly improved function - No prior treatments med or counseling  Plan: 1. Initially patient hesitant to increase dose SSRI - discussion on benefits and med management - agreed to proceed with increased SSRI Escitalopram  daily - new rx sent to pharmacy. Goal to help improve anxiety and insomnia 2. In future, advised should pursue Psychology or counseling assistance, already has handout last visit on recommended local psychology therapist, self-referral in addition to med therapy 3. Follow-up 4-6 weeks - review PHQ9 progress, med adjust, may add adjunct therapy if needed, consider Buspar (anxiety) vs Wellbutrin (smoker)

## 2016-05-23 NOTE — Assessment & Plan Note (Signed)
Likely underlying etiology of acute on chronic LBP, last on imaging lumbar X-ray 07/2013 with L4-5 and L5-S1 DDD. Suspected contributing factor with morbid obesity BMI >50 - See A&P

## 2016-05-23 NOTE — Assessment & Plan Note (Signed)
Not improved on SSRI Escitalopram  daily, likely secondary to anxiety/depression - See A&P, increased Escitalopram 10 to  daily, in future consider addjunct therapy

## 2016-05-23 NOTE — Progress Notes (Addendum)
Subjective:    Patient ID: Ashley Olson, female    DOB: 13-Nov-1985, 31 y.o.   MRN: [id number]  Ashley Olson is a 31 y.o. female presenting on 05/23/2016 for Back Pain; Depression; and Anxiety  HPI  FOLLOW-UP ANXIETY with PANIC ATTACKS / DEPRESSION / INSOMNIA - Last visit 04/18/16 to establish care, see for initial background information on chronic Anxiety/Depression, she was started on SSRI treatment with Escitalopram  daily, and given handout on psychology/counseling - Today here for follow-up. She has been taking SSRI for past 4 weeks, tolerating it well without side effects. Does admit to some clinical improvement in her symptoms overall. Also admits to increased emotions with crying spells sometimes spontaneous without triggering emotion. See PHQ/GAD scores below, overall still very significant and difficult for her. Family thought that she was not significantly improved as well - Admits to significant life stressors still bothering her but overall she has made improvements in 1 month, now moved and trying to settle in - Admits still insomnia difficulty falling asleep and now some difficulty with waking up often at 0300 most nights, some difficulty falling back asleep - No personal or family history of mental health disorder - Denies any current concerns to her or her daughters' safety - Denies any suicidal or homicidal ideation  Acute on Chronic LOW BACK PAIN, Left sided with history of Sciatica - Prior history of DJD chronic low back pain, with L sciatica, and possible bulging disc, 2-3 years ago, had initial painful back flare and went to hospital ED, then sent to physical therapy with improvement, has had intermittent low back pain flares since, initially had been every 2-3 months, now gradually improving overall to q 3-6 months now. Other treatments tried in past include Tramadol, Flexeril. She did not like the way she felt on Tramadol. - Currently complaining of new acute  low back pain flare. Reports symptoms started about 1 week ago without inciting injury that triggered current flare, persistent without improvement over past 1 week. Describes pain as aching "spasm" and shooting pain, severity 6/10 on average worse at night while unable to lay on back / side with intermittent worsening laying on side or back at night, bending forward or walking, prolonged sitting, sometimes improved if standing. No pain radiating to legs currently, in the past has had sciatica with Left sided radiating pain - Taking Ibuprofen  nightly during flare only - now with some moderate relief, taken for 1 week so far - Not taking Tylenol, never tried for this - Tried heating pad at night (some relief) - No prior Orthopedics evaluation. No prior back surgery or other interventions. She has used Prednisone in past for back pain flare and also Gabapentin (she took gabapentin along with several other meds years ago and does not recall if effective) - Denies any fevers/chills, numbness, tingling, weakness, loss of control bladder/bowel incontinence or retention, unintentional wt loss, night sweats   GAD 7 : Generalized Anxiety Score 05/23/2016 04/18/2016  Nervous, Anxious, on Edge 2 3  Control/stop worrying 3 3  Worry too much - different things 3 3  Trouble relaxing 3 3  Restless 2 3  Easily annoyed or irritable 3 3  Afraid - awful might happen 2 2  Total GAD 7 Score 18 20  Anxiety Difficulty Somewhat difficult Very difficult    Depression screen [facility] 2/9 05/23/2016 04/18/2016  Decreased Interest 2 3  Down, Depressed, Hopeless 3 2  PHQ - 2 Score 5 5  Altered  sleeping 3 2  Tired, decreased energy 2 3  Change in appetite (No Data) 2  Feeling bad or failure about yourself  2 2  Trouble concentrating 2 2  Moving slowly or fidgety/restless 2 3  Suicidal thoughts 0 0  PHQ-9 Score 16 19  Difficult doing work/chores Somewhat difficult -   Social History  Substance Use Topics  .  Smoking status: Current Some Day Smoker    Packs/day: 0.20    Types: Cigarettes  . Smokeless tobacco: Current User  . Alcohol use Yes    Review of Systems   Per HPI unless specifically indicated above     Objective:    BP 129/82   Pulse 78   Temp 98.3 F (36.8 C) (Oral)   Resp 16   Ht 6' (1.829 m)   Wt (!) 372 lb (168.7 kg)   LMP 05/12/2016   BMI 50.45 kg/m   Wt Readings from Last 3 Encounters:  05/23/16 (!) 372 lb (168.7 kg)  04/18/16 (!) 372 lb 9.6 oz (169 kg)  03/16/16 (!) 400 lb (181.4 kg)    Physical Exam  Constitutional: She is oriented to person, place, and time. She appears well-developed and well-nourished. No distress.  Well-appearing, comfortable, cooperative, obese  HENT:  Head: Normocephalic and atraumatic.  Mouth/Throat: Oropharynx is clear and moist.  Eyes: Conjunctivae are normal.  Cardiovascular: Normal rate, regular rhythm, normal heart sounds and intact distal pulses.   No murmur heard. Pulmonary/Chest: Effort normal.  Musculoskeletal:  Low Back Inspection: Normal appearance, Large body habitus, no spinal deformity, symmetrical. Palpation: No tenderness over spinous processes. Tenderness to palpation over Left lower lumbar paraspinal region and into buttocks., Lumbar paraspinal muscles with hypertonicity and spasm. ROM: Slightly reduced ROM forward flex / back extension due to pain Special Testing: Seated SLR negative for radicular pain bilaterally, some pulling or tightening stretch pain on left side in back only Strength: Bilateral hip flex/ext 5/5, knee flex/ext 5/5, ankle dorsiflex/plantarflex 5/5 Neurovascular: intact distal sensation to light touch  Neurological: She is alert and oriented to person, place, and time.  Skin: Skin is warm and dry. She is not diaphoretic.  Psychiatric: Her behavior is normal.  Well groomed, good eye contact, normal speech and thoughts, does not appear overly anxious very cooperative, mood remains positive and  good, some mild sadness when discussing symptoms and stressors but no crying spells or other labile emotions.  Nursing note and vitals reviewed.  I have personally reviewed the radiology report from 07/13/13 on Lumbar Spine X-ray.  CLINICAL DATA:  No injury.  Low back pain radiating down right leg.   EXAM:  LUMBAR SPINE - 2-3 VIEW   COMPARISON:  CT abdomen 02/14/2012.   FINDINGS:  Vertebral body alignment and heights are within normal. There is  minimal disc space narrowing at the L4-5 and L5-S1 levels. There is  no compression fracture or subluxation. Mild fecal retention over  the right colon and transverse colon.   IMPRESSION:  Evidence of early degenerative disc disease at the L4-5 and L5-S1  levels.    Electronically Signed    By: Elberta Fortis M.D.    On: 07/13/2013 12:20       Assessment & Plan:   Problem List Items Addressed This Visit    Lumbar degenerative disc disease    Likely underlying etiology of acute on chronic LBP, last on imaging lumbar X-ray 07/2013 with L4-5 and L5-S1 DDD. Suspected contributing factor with morbid obesity BMI >50 - See A&P  Relevant Medications   meloxicam (MOBIC) 15 MG tablet   cyclobenzaprine (FLEXERIL) 10 MG tablet   Insomnia    Not improved on SSRI Escitalopram  daily, likely secondary to anxiety/depression - See A&P, increased Escitalopram 10 to  daily, in future consider addjunct therapy      Relevant Medications   escitalopram (LEXAPRO) 20 MG tablet   GAD (generalized anxiety disorder)    Stable to minimally improved moderate to severe generalized anxiety with panic. Now on SSRI x 4 weeks. - Complicated by recurrent mod/severe MDD, and secondary insomnia. - GAD7: 20 (very difficult) > 18 (somewhat difficult) - Slightly improved function - No prior treatments med or counseling  Plan: 1. Initially patient hesitant to increase dose SSRI - discussion on benefits and med management - agreed to proceed with  increased SSRI Escitalopram  daily - new rx sent to pharmacy. Goal to help improve anxiety and insomnia 2. In future, advised should pursue Psychology or counseling assistance, already has handout last visit on recommended local psychology therapist, self-referral in addition to med therapy 3. Follow-up 4-6 weeks - review PHQ9 progress, med adjust, may add adjunct therapy if needed, consider Buspar (anxiety) vs Wellbutrin (smoker)      Relevant Medications   escitalopram (LEXAPRO) 20 MG tablet   Depression, recurrent (HCC)    Stable to minimally improved moderate to severe major depression, recurrent without psychotic features. Now on SSRI x 4 weeks. - Complicated by significant anxiety with panic, and secondary insomnia. - PHQ9: 19 > 16 - Currently still functioning, several life stressors - No prior treatments med or counseling  Plan: 1. Initially patient hesitant to increase dose SSRI - discussion on benefits and med management - agreed to proceed with increased SSRI Escitalopram  daily - new rx sent to pharmacy. Goal to help improve anxiety and insomnia 2. In future, advised should pursue Psychology or counseling assistance, already has handout last visit on recommended local psychology therapist, self-referral in addition to med therapy 3. Follow-up 4-6 weeks - review PHQ9 progress, med adjust, may add adjunct therapy if needed, consider Buspar (anxiety) vs Wellbutrin (smoker)      Relevant Medications   escitalopram (LEXAPRO) 20 MG tablet   Chronic low back pain with left-sided sciatica - Primary    Acute on chronic L LBP without current associated L sciatica (has had previously). Suspect likely due to muscle spasm/strain, without known injury or trauma. In setting of known >3 year chronic LBP with DJD, and morbid obesity BMI >50 - No red flag symptoms. Negative SLR for radiculopathy - Responding to initial conservative therapy - Last imaging Lumbar X-ray 07/2013 - early DJD  L4-5, L5-S1  Plan: 1. Start regular rx anti-inflammatory trial with rx Meloxicam  daily wc x 2-4 weeks, then PRN - stop OTC Ibuprofen 800 for now 2. Start muscle relaxant with Flexeril  tabs - take 5-10mg  up to TID PRN, titrate up as tolerated, max dose  BID 3. May use Tylenol PRN for breakthrough up to 1g TID - also advised that in future once out of flare, should use Tylenol as first line regular dosing and then add NSAID only if flare 4. Encouraged use of heating pad 1-2x daily for now then PRN 5. Follow-up 4-6 weeks for anticipated Annual Physical, consider update X-rays in future, may need repeat PT trial, if sciatica consider prednisone burst (caution wt gain) also can revisit gabapentin if needed      Relevant Medications   meloxicam (MOBIC) 15 MG tablet  cyclobenzaprine (FLEXERIL) 10 MG tablet   escitalopram (LEXAPRO) 20 MG tablet     Meds ordered this encounter  Medications  . meloxicam (MOBIC) 15 MG tablet    Sig: Take 1 tablet (15 mg total) by mouth daily. With food, for 2-4 weeks as needed for back pain    Dispense:  30 tablet    Refill:  2  . cyclobenzaprine (FLEXERIL) 10 MG tablet    Sig: Take 1-2 tablets (10-20 mg total) by mouth 2 (two) times daily as needed for muscle spasms.    Dispense:  30 tablet    Refill:  2  . escitalopram (LEXAPRO) 20 MG tablet    Sig: Take 1 tablet (20 mg total) by mouth daily. With food    Dispense:  30 tablet    Refill:  5      Follow up plan: Return in about 4 weeks (around 06/20/2016) for 4-6 weeks for Annual Physical, lab results, GAD Anxiety.  Re-schedule Annual Physical appointment for 4-6 weeks since patient was unable to get labwork done ahead of days visit and she has additional acute medical problem that we discussed today. Re-printed [facility] lab orders and given to patient today.  Saralyn Pilar, DO [facility] 05/23/2016, 10:56 PM

## 2016-05-23 NOTE — Assessment & Plan Note (Signed)
Acute on chronic L LBP without current associated L sciatica (has had previously). Suspect likely due to muscle spasm/strain, without known injury or trauma. In setting of known >3 year chronic LBP with DJD, and morbid obesity BMI >50 - No red flag symptoms. Negative SLR for radiculopathy - Responding to initial conservative therapy - Last imaging Lumbar X-ray 07/2013 - early DJD L4-5, L5-S1  Plan: 1. Start regular rx anti-inflammatory trial with rx Meloxicam  daily wc x 2-4 weeks, then PRN - stop OTC Ibuprofen 800 for now 2. Start muscle relaxant with Flexeril  tabs - take 5-10mg  up to TID PRN, titrate up as tolerated, max dose  BID 3. May use Tylenol PRN for breakthrough up to 1g TID - also advised that in future once out of flare, should use Tylenol as first line regular dosing and then add NSAID only if flare 4. Encouraged use of heating pad 1-2x daily for now then PRN 5. Follow-up 4-6 weeks for anticipated Annual Physical, consider update X-rays in future, may need repeat PT trial, if sciatica consider prednisone burst (caution wt gain) also can revisit gabapentin if needed

## 2016-05-23 NOTE — Assessment & Plan Note (Signed)
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 Now on SSRI x 4 weeks. - Complicated by significant anxiety with panic, and secondary insomnia. - PHQ9: 19 > 16 - Currently still functioning, several life stressors - No prior treatments med or counseling  Plan: 1. Initially patient hesitant to increase dose SSRI - discussion on benefits and med management - agreed to proceed with increased SSRI Escitalopram  daily - new rx sent to pharmacy. Goal to help improve anxiety and insomnia 2. In future, advised should pursue Psychology or counseling assistance, already has handout last visit on recommended local psychology therapist, self-referral in addition to med therapy 3. Follow-up 4-6 weeks - review PHQ9 progress, med adjust, may add adjunct therapy if needed, consider Buspar (anxiety) vs Wellbutrin (smoker)

## 2016-06-20 ENCOUNTER — Ambulatory Visit
Admission: RE | Admit: 2016-06-20 | Discharge: 2016-06-20 | Disposition: A | Discharge status: Home / Self Care | Payer: 59 | Source: Ambulatory Visit | Attending: Physician Assistant | Admitting: Physician Assistant

## 2016-06-20 ENCOUNTER — Other Ambulatory Visit: Payer: Self-pay | Admitting: Physician Assistant

## 2016-06-20 ENCOUNTER — Ambulatory Visit
Admission: RE | Admit: 2016-06-20 | Discharge: 2016-06-20 | Disposition: A | Discharge status: Home / Self Care | Payer: 59 | Source: Ambulatory Visit | Attending: Family Medicine | Admitting: Family Medicine

## 2016-06-20 ENCOUNTER — Other Ambulatory Visit: Payer: Self-pay | Admitting: Family Medicine

## 2016-06-20 DIAGNOSIS — M4807 Spinal stenosis, lumbosacral region: Secondary | ICD-10-CM | POA: Insufficient documentation

## 2016-06-20 DIAGNOSIS — M545 Low back pain: Secondary | ICD-10-CM | POA: Insufficient documentation

## 2016-06-30 ENCOUNTER — Encounter: Payer: 59 | Admitting: Family Medicine

## 2016-11-02 ENCOUNTER — Encounter: Payer: Self-pay | Admitting: Family Medicine

## 2016-11-02 ENCOUNTER — Ambulatory Visit (INDEPENDENT_AMBULATORY_CARE_PROVIDER_SITE_OTHER): Payer: 59 | Admitting: Family Medicine

## 2016-11-02 VITALS — BP 131/81 | HR 82 | Temp 98.2°F | Ht 71.0 in | Wt 386.0 lb

## 2016-11-02 DIAGNOSIS — G8929 Other chronic pain: Secondary | ICD-10-CM | POA: Diagnosis not present

## 2016-11-02 DIAGNOSIS — F339 Major depressive disorder, recurrent, unspecified: Secondary | ICD-10-CM

## 2016-11-02 DIAGNOSIS — R350 Frequency of micturition: Secondary | ICD-10-CM

## 2016-11-02 DIAGNOSIS — M5136 Other intervertebral disc degeneration, lumbar region: Secondary | ICD-10-CM

## 2016-11-02 DIAGNOSIS — F5105 Insomnia due to other mental disorder: Secondary | ICD-10-CM

## 2016-11-02 DIAGNOSIS — F99 Mental disorder, not otherwise specified: Secondary | ICD-10-CM | POA: Diagnosis not present

## 2016-11-02 DIAGNOSIS — M5442 Lumbago with sciatica, left side: Secondary | ICD-10-CM | POA: Diagnosis not present

## 2016-11-02 DIAGNOSIS — Z7689 Persons encountering health services in other specified circumstances: Secondary | ICD-10-CM | POA: Diagnosis not present

## 2016-11-02 DIAGNOSIS — F411 Generalized anxiety disorder: Secondary | ICD-10-CM

## 2016-11-02 MED ORDER — IBUPROFEN 800 MG PO TABS: 800.0000 mg | ORAL_TABLET | Freq: Three times a day (TID) | ORAL | 2 refills | Status: DC | PRN

## 2016-11-02 MED ORDER — HYDROXYZINE HCL 25 MG PO TABS: 25.0000 mg | ORAL_TABLET | Freq: Three times a day (TID) | ORAL | 1 refills | Status: DC | PRN

## 2016-11-02 MED ORDER — BUSPIRONE HCL 10 MG PO TABS: ORAL_TABLET | ORAL | 1 refills | Status: DC

## 2016-11-02 MED ORDER — SULFAMETHOXAZOLE-TRIMETHOPRIM 800-160 MG PO TABS: 1.0000 | ORAL_TABLET | Freq: Two times a day (BID) | ORAL | 0 refills | Status: DC

## 2016-11-02 MED ORDER — CYCLOBENZAPRINE HCL 10 MG PO TABS: 10.0000 mg | ORAL_TABLET | Freq: Three times a day (TID) | ORAL | 2 refills | Status: DC | PRN
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
## 2016-11-03 NOTE — Assessment & Plan Note (Signed)
PT referral placed as this has helped in the past. Continue meloxicam prn, can try lidocaine patches. Will f/u in 1 month about progress

## 2016-11-03 NOTE — Assessment & Plan Note (Addendum)
PT referral placed as this has helped in the past. Continue meloxicam, flexeril prn, can try lidocaine patches. Will f/u in 1 month about progress

## 2016-11-03 NOTE — Assessment & Plan Note (Signed)
Will start buspar with hydroxyzine dosed at night to help with her insomnia sxs. Discussed risks and benefits, pt agreeable to giving this regimen a few months. Will f/u in 1 month for recheck. Sedation precautions reviewed with hydroxyzine

## 2016-11-03 NOTE — Patient Instructions (Signed)
Follow up in 1 month   

## 2016-11-03 NOTE — Assessment & Plan Note (Signed)
Hydroxyzine QHS prn, essential oils, sleep hygiene reviewed.

## 2016-11-03 NOTE — Assessment & Plan Note (Signed)
List of local counselors given, will start buspar and hydroxyzine and monitor closely for benefit.

## 2016-11-06 LAB — UA/M W/RFLX CULTURE, ROUTINE
Bilirubin, UA: NEGATIVE
Glucose, UA: NEGATIVE
Ketones, UA: NEGATIVE
NITRITE UA: NEGATIVE
PH UA: 7 (ref 5.0–7.5)
Protein, UA: NEGATIVE
Specific Gravity, UA: 1.02 (ref 1.005–1.030)
Urobilinogen, Ur: 1 mg/dL (ref 0.2–1.0)

## 2016-11-06 LAB — MICROSCOPIC EXAMINATION: RBC MICROSCOPIC, UA: NONE SEEN /HPF (ref 0–?)

## 2016-11-06 LAB — URINE CULTURE, REFLEX

## 2016-11-14 ENCOUNTER — Ambulatory Visit: Payer: 59 | Attending: Family Medicine

## 2016-11-14 DIAGNOSIS — M5442 Lumbago with sciatica, left side: Secondary | ICD-10-CM | POA: Insufficient documentation

## 2016-11-14 DIAGNOSIS — R262 Difficulty in walking, not elsewhere classified: Secondary | ICD-10-CM | POA: Insufficient documentation

## 2016-11-14 DIAGNOSIS — G8929 Other chronic pain: Secondary | ICD-10-CM | POA: Diagnosis present

## 2016-11-14 DIAGNOSIS — M6281 Muscle weakness (generalized): Secondary | ICD-10-CM | POA: Diagnosis present

## 2016-11-14 NOTE — Therapy (Signed)
[facility]ICINE [address] [place], [address] Phone: [phone]   Fax:  [phone]  Physical Therapy Evaluation  Patient Details  Name: Ashley Olson MRN: [id number] Date of Birth: October 11, 1985 Referring Provider: Roosvelt Maser  Encounter Date: 11/14/2016      PT End of Session - 11/14/16 1047    Visit Number 1   Number of Visits 17   Date for PT Re-Evaluation 12/26/16   PT Start Time 1020   PT Stop Time 1110   PT Time Calculation (min) 50 min   Activity Tolerance Patient tolerated treatment well   Behavior During Therapy [facility] for tasks assessed/performed      Past Medical History:  Diagnosis Date  . Morbid obesity (HCC)   . Sciatic pain     Past Surgical History:  Procedure Laterality Date  . APPENDECTOMY      There were no vitals filed for this visit.       Subjective Assessment - 11/14/16 1030    Subjective Low back pain   Pertinent History Pt reports that she has had chronic low back pain for the last 2 years. She has a prior history of DJD chronic low back pain, with L sciatica, and possible bulging disc. No known trauma. She woke up one morning with severe pain and states that she couldn't get out of bed. She went to the ED and was ultimately referred for physical therapy She had a bout of physical therapy for 1 month which made a significant improvement in her pain. She continued to have intermittent flares every few months. She has noticed an increase in her pain over the last couple months which is more persistent. No recent trauma or known cause. She has kept her exercises from the therapist a couple years ago but doesn't notice improvement with them. Pain is worse on the L side and then radiates down the lateral and posterior aspects of her left leg. Pain extends to the back of her left knee. She reports intermittent numbness/tingling in her entire LLE down to her feet/toes. Infrequent  buckling of LLE if she is walking an extended distance. Pt reports she has had radiographs but never an MRI of her spine. Chart review indicates L4/5, L5/S1 disc space narrowing. ROS negative for red flags. Denies any fevers/chills, loss of control bladder/bowel incontinence, unintentional wt gain/loss, night sweats. Pt does report recent increase in stress/anxiety. She has been on medications to help her sleep at night   How long can you sit comfortably? 1 hour   How long can you stand comfortably? 20 minutes   How long can you walk comfortably? 30 on flat ground   Diagnostic tests radiographs 06/2016: Minimal L4-5 and mild L5-S1 disc space narrowing without change, no MRI per patient   Patient Stated Goals "Get rid of my pain." "Walk and do things that I enjoy without pain." Pt states she would like to be able to shoot guns, play pool, and go fishing without pain   Currently in Pain? Yes   Pain Score 5   Worst: 10/10; Best: 3.5/10   Pain Location Back   Pain Orientation Left;Lower   Pain Descriptors / Indicators Tingling;Burning;Shooting   Pain Type Chronic pain   Pain Radiating Towards Down LLE   Pain Onset More than a month ago   Pain Frequency Constant   Aggravating Factors  sitting or standing for extended periods, walking extending periods, bending forward   Pain  Relieving Factors heating pad, flexeril (taking 3-4), meloxicam, sitting with lumbar support            [facility] PT Assessment - 11/14/16 1036      Assessment   Medical Diagnosis Lumbar degeneartive disc disease   Referring Provider Roosvelt Maser   Onset Date/Surgical Date 09/14/16  Chronic low back pain x 2-3 years   Hand Dominance Right   Next MD Visit November 2018   Prior Therapy Yes, 2 years ago     Precautions   Precautions None     Restrictions   Weight Bearing Restrictions No     Balance Screen   Has the patient fallen in the past 6 months No   Has the patient had a decrease in activity level because of a  fear of falling?  No   Is the patient reluctant to leave their home because of a fear of falling?  No     Home [employer] information [employer] residence   Living Arrangements Children     Prior Function   Level of Independence Independent   Vocation Full time employment   Therapist, occupational service, spends entire day sitting   Leisure Travel, kids 12 and 7     Cognition   Overall Cognitive Status Within Functional Limits for tasks assessed     Observation/Other Assessments   Other Surveys  Other Surveys   Modified [place] 52%   Fear Avoidance Belief Questionnaire (FABQ)  PA: 20/30     Sensation   Additional Comments Random distribution of numbness in LLE with numbness primarily reported along L2-L3 and S1-S2.      Posture/Postural Control   Posture Comments forward flexed posture in standing with excessive lumbar lordosis. Bilateral knee valgus, worse on LLE     ROM / Strength   AROM / PROM / Strength AROM;Strength     AROM   Overall AROM Comments Pain and limitation with AROM lumbar flexion and extension with peripheralization during both. Worsening of pain and peripheralization with repeated extension. Mild pain with lumbar rotation. R lumbar pain with L lateral flexion and L lumbar pain with R lateral flexion. Positive slump on the L with positive sensitizing L ankle DF, positive SLR 35 degrees LLE negative on RLE, pain with supine bridge, knee to chest is painless on R side and painful on LLE with radiating symptoms, pain with L hip scour; pain with prone L hip extension. Attempted CPA of lower lumbar spine but unable to even perform grade I mobilizations due to increase in pain with palpation. Pt reports minimal pain with grade I/II CPA at L1/L2.      Strength   Overall Strength Comments Grossly 4+/5 throughout bilateral LE with hip flexion, knee flex/ext, and ankle DF. 4/5 hip IR/ER bilaterally. Weak and painful L hip extension in prone, 4/5 R hip  extension. Unable to comfortably test hip abduction strength in sidelying.      Palpation   Palpation comment pain with light palpation along central and left lumbar spine.      Transfers   Comments Abberant movements with sit to stand transfers due to pain            Objective measurements completed on examination: See above findings.      TREATMENT  Ther-ex Hooklying lumbar rotation x 10 bilateral; Hooklying ant/post pelvic tilts with tactile cues for proper form/technique x 10 each; Written HEP provided with extensive education;  PT Education - 11/14/16 1047    Education provided Yes   Education Details HEP and plan of care   Person(s) Educated Patient   Methods Explanation   Comprehension Verbalized understanding             PT Long Term Goals - 11/14/16 1336      PT LONG TERM GOAL #1   Title Pt will be independent with HEP in order to improve strength and decrease pain in order to improve pain-free function at home and work with less pain.   Time 6   Period Weeks   Status New   Target Date 12/26/16     PT LONG TERM GOAL #2   Title Pt will decrease mODI scoreby at least 13 points in order demonstrate clinically significant reduction in pain/disability   Baseline 11/14/16: 52%   Time 6   Period Weeks   Status New   Target Date 12/26/16     PT LONG TERM GOAL #3   Title Pt will report worst low back pain as 8/10 on NPRS in order to demonstrate clinically significant reduction in pain   Baseline 11/14/16: worst: 10/10   Time 6   Period Weeks   Status New   Target Date 12/26/16                Plan - 11/14/16 1323    Clinical Impression Statement Pt is a pleasant 31 yo AA female referred for lumbar degenerative disc disease. She has had chronic low back pain for the last 2 years with intermittent flares the most recent of which started a couple months ago. She has a prior history of DJD chronic low back pain, with L sciatica  with possible bulging disc per medical report. Pt is complaining today of L low back pain that radiates down the lateral and posterior aspects of her left leg. Pain extends to the back of her left knee. She reports intermittent numbness/tingling in her entire LLE down to her feet/toes. Infrequent buckling of LLE if she is walking an extended distance. PT examination today reveals highly irritability pain. She reports severe pain with light palpation along central and left lumbar spine musculature. She is unable to tolerate CPA assessment of lower lumbar spine due to pain. She reports worsening pain with both lumbar flexion and extension without any definite directional preference. Positive slump on the L with positive sensitizing L ankle DF as well as positive SLR at 35 degrees as well. Patient's age, motivation, and prior history of benefit from PT are all positive prognostic indications. However the distal radicular nature of her symptoms including sensory deficits and weakness, her job requirements, the chronicity of her symptoms, and her morbid obesity are all negative prognostic indicators for being successful with conservative measures. Pt will benefit from skilled PT services to address deficits in pain, mobility, and strength in order to improve her function at home and work with less pain.   History and Personal Factors relevant to plan of care: 3 or more personal factors/comorbidities, 4 or more body systems/activity limitations/participation restrictions   Clinical Presentation Unstable   Clinical Presentation due to: Highly variable and highly irritable symptoms   Clinical Decision Making High   Rehab Potential Fair   Clinical Impairments Affecting Rehab Potential Patient's age, motivation, and prior history of benefit from PT are all positive prognostic indications. However the distal radicular nature of her symptoms including sensory deficits and weakness, her job requirements, the chronicity of  her symptoms,  and her morbid obesity are all negative prognostic indicators for being successful with conservative measures. Pt will benefit from skilled PT services to address deficits in pain, mobility, and strength in order to improve her function at home and work with less pain.    PT Frequency 2x / week   PT Duration 6 weeks   PT Treatment/Interventions ADLs/Self Care Home Management;Aquatic Therapy;Biofeedback;Cryotherapy;Electrical Stimulation;Iontophoresis /ml Dexamethasone;Moist Heat;Traction;Ultrasound;DME Instruction;Gait training;Stair training;Functional mobility training;Therapeutic exercise;Therapeutic activities;Balance training;Neuromuscular re-education;Patient/family education;Manual techniques;Passive range of motion;Dry needling;Energy conservation   PT Next Visit Plan progress pain control modalities in order to progress ROM and strengthening with less pain. 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 Lumbar degenerative disc disease 05/23/2016  . 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 Depression, recurrent (HCC) 04/18/2016  . GAD (generalized anxiety disorder) 04/18/2016  . Insomnia 04/18/2016  . Elevated BP without diagnosis of hypertension 04/18/2016  . Microcytic anemia 04/18/2016  . Morbid obesity (HCC) 03/10/2014   . Tobacco abuse 03/10/2014   Lynnea Maizes PT, DPT   Huprich,Jason 11/14/2016, 1:43 PM  [facility] PHYSICAL AND SPORTS MEDICINE [address], [place], [address] Phone: [phone]   Fax:  [phone]  Name: Ashley Olson MRN: [id number] Date of Birth: Jul 11, 1985

## 2016-11-16 ENCOUNTER — Ambulatory Visit: Payer: 59 | Admitting: Physical Therapy

## 2016-11-16 DIAGNOSIS — M5442 Lumbago with sciatica, left side: Secondary | ICD-10-CM | POA: Diagnosis not present

## 2016-11-16 DIAGNOSIS — M6281 Muscle weakness (generalized): Secondary | ICD-10-CM

## 2016-11-16 DIAGNOSIS — R262 Difficulty in walking, not elsewhere classified: Secondary | ICD-10-CM

## 2016-11-16 DIAGNOSIS — G8929 Other chronic pain: Secondary | ICD-10-CM

## 2016-11-16 NOTE — Patient Instructions (Addendum)
Patient with difficulty laying on her back, SLR weak and painful, no cross-sign noted.   High volt continuous stimulation at 85 V with moist hot pack (over L distal lumbar flank into superior gluteal musculature)   Seated isometric clamshells/abduction/ER with belt x 10 for 2 sets for 10" holds

## 2016-11-19 NOTE — Therapy (Signed)
[facility] [address] [place], [address] Phone: [phone]   Fax:  [phone]  Physical Therapy Treatment  Patient Details  Name: Ashley Olson MRN: [id number] Date of Birth: 09/20/1985 Referring Provider: Roosvelt Maser  Encounter Date: 11/16/2016      PT End of Session - 11/19/16 2036    Visit Number 3   Number of Visits 17   Date for PT Re-Evaluation 12/26/16   PT Start Time 1425   PT Stop Time 1505   PT Time Calculation (min) 40 min   Activity Tolerance Patient tolerated treatment well;Patient limited by pain   Behavior During Therapy [facility] for tasks assessed/performed      Past Medical History:  Diagnosis Date  . Morbid obesity (HCC)   . Sciatic pain     Past Surgical History:  Procedure Laterality Date  . APPENDECTOMY      There were no vitals filed for this visit.      Subjective Assessment - 11/19/16 2036    Subjective Patient reports she has had flare ups of L sided low back pain, pain is now largely from greater trochanter to lumbar spine. Pins and needles, burning sensations primarily on L side. Numbness/ in the leg and feet and pain shooting down the leg when she gets a flare up. Flare ups seem to be inconsistent with the nature/cause. Bending over can lead to a flare up.    Pertinent History Pt reports that she has had chronic low back pain for the last 2 years. She has a prior history of DJD chronic low back pain, with L sciatica, and possible bulging disc. No known trauma. She woke up one morning with severe pain and states that she couldn't get out of bed. She went to the ED and was ultimately referred for physical therapy She had a bout of physical therapy for 1 month which made a significant improvement in her pain. She continued to have intermittent flares every few months. She has noticed an increase in her pain over the last couple months which is more persistent. No recent  trauma or known cause. She has kept her exercises from the therapist a couple years ago but doesn't notice improvement with them. Pain is worse on the L side and then radiates down the lateral and posterior aspects of her left leg. Pain extends to the back of her left knee. She reports intermittent numbness/tingling in her entire LLE down to her feet/toes. Infrequent buckling of LLE if she is walking an extended distance. Pt reports she has had radiographs but never an MRI of her spine. Chart review indicates L4/5, L5/S1 disc space narrowing. ROS negative for red flags. Denies any fevers/chills, loss of control bladder/bowel incontinence, unintentional wt gain/loss, night sweats. Pt does report recent increase in stress/anxiety. She has been on medications to help her sleep at night   How long can you sit comfortably? 1 hour   How long can you stand comfortably? 20 minutes   How long can you walk comfortably? 30 on flat ground   Diagnostic tests radiographs 06/2016: Minimal L4-5 and mild L5-S1 disc space narrowing without change, no MRI per patient   Patient Stated Goals "Get rid of my pain." "Walk and do things that I enjoy without pain." Pt states she would like to be able to shoot guns, play pool, and go fishing without pain   Currently in Pain? Yes  Most days pain is mild, with  activity can become severe. Currently reports pain as mild.   Pain Location Back   Pain Orientation Left;Lower   Pain Descriptors / Indicators Aching;Tingling;Burning;Shooting   Pain Type Chronic pain   Pain Onset More than a month ago   Pain Frequency Constant        Patient with difficulty laying on her back, SLR weak and painful, no cross-sign noted. Unable to tolerate any further testing in this position, thus had to discontinue   High volt continuous stimulation at 85 V with moist hot pack (over L distal lumbar flank into superior gluteal musculature) x 15 minutes, patient reported very mild discomfort after  completion while ambulating, allowing for assessment of response to treatment   Seated isometric clamshells/abduction/ER with belt x 10 for 2 sets for 10" holds -- reports no pain after completion of 2nd bout                           PT Education - 11/19/16 2036    Education provided Yes   Education Details Added isometric clamshells with belt.    Person(s) Educated Patient   Methods Explanation;Demonstration;Handout   Comprehension Verbalized understanding;Returned demonstration             PT Long Term Goals - 11/14/16 1336      PT LONG TERM GOAL #1   Title Pt will be independent with HEP in order to improve strength and decrease pain in order to improve pain-free function at home and work with less pain.   Time 6   Period Weeks   Status New   Target Date 12/26/16     PT LONG TERM GOAL #2   Title Pt will decrease mODI scoreby at least 13 points in order demonstrate clinically significant reduction in pain/disability   Baseline 11/14/16: 52%   Time 6   Period Weeks   Status New   Target Date 12/26/16     PT LONG TERM GOAL #3   Title Pt will report worst low back pain as 8/10 on NPRS in order to demonstrate clinically significant reduction in pain   Baseline 11/14/16: worst: 10/10   Time 6   Period Weeks   Status New   Target Date 12/26/16               Plan - 11/19/16 2033    Clinical Impression Statement Patient reports her HEP has not been able to help ease her symptoms. She responds well to modalities provided today, reporting no pain after completion of session. Her symptoms appear to have some mechanical influence as she certainly has positional preferences sitting > standing/supine. She will likely benefit from modalities before a more active approach is able to be tolerated.    Clinical Presentation Unstable   Clinical Decision Making High   Rehab Potential Fair   Clinical Impairments Affecting Rehab Potential Patient's age,  motivation, and prior history of benefit from PT are all positive prognostic indications. However the distal radicular nature of her symptoms including sensory deficits and weakness, her job requirements, the chronicity of her symptoms, and her morbid obesity are all negative prognostic indicators for being successful with conservative measures. Pt will benefit from skilled PT services to address deficits in pain, mobility, and strength in order to improve her function at home and work with less pain.    PT Frequency 2x / week   PT Duration 6 weeks   PT Treatment/Interventions ADLs/Self Care Home Management;Aquatic  Therapy;Biofeedback;Cryotherapy;Electrical Stimulation;Iontophoresis /ml Dexamethasone;Moist Heat;Traction;Ultrasound;DME Instruction;Gait training;Stair training;Functional mobility training;Therapeutic exercise;Therapeutic activities;Balance training;Neuromuscular re-education;Patient/family education;Manual techniques;Passive range of motion;Dry needling;Energy conservation   PT Next Visit Plan progress pain control modalities in order to progress ROM and strengthening with less pain. Chronic pain education   PT Home Exercise Plan Hooklying lumbar rocks, hooklying pelvic tilts   Consulted and Agree with Plan of Care Patient      Patient will benefit from skilled therapeutic intervention in order to improve the following deficits and impairments:  Abnormal gait, Difficulty walking, Decreased strength, Pain, Obesity  Visit Diagnosis: Chronic left-sided low back pain with left-sided sciatica  Muscle weakness (generalized)  Difficulty in walking, not elsewhere classified     Problem List Patient Active Problem List   Diagnosis Date Noted  . Lumbar degenerative disc disease 05/23/2016  . 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 Depression, recurrent (HCC) 04/18/2016  . GAD (generalized anxiety disorder) 04/18/2016  . Insomnia 04/18/2016  . Elevated BP  without diagnosis of hypertension 04/18/2016  . Microcytic anemia 04/18/2016  . Morbid obesity (HCC) 03/10/2014  . Tobacco abuse 03/10/2014    Alva Garnet PT, DPT, CSCS    11/19/2016, 8:40 PM  [facility] PHYSICAL AND SPORTS MEDICINE [address] [place], [address] Phone: [phone]   Fax:  [phone]  Name: Ashley Olson MRN: [id number] Date of Birth: 09/28/1985

## 2016-11-20 ENCOUNTER — Ambulatory Visit: Payer: 59 | Admitting: Physical Therapy

## 2016-11-20 DIAGNOSIS — G8929 Other chronic pain: Secondary | ICD-10-CM

## 2016-11-20 DIAGNOSIS — R262 Difficulty in walking, not elsewhere classified: Secondary | ICD-10-CM

## 2016-11-20 DIAGNOSIS — M5442 Lumbago with sciatica, left side: Secondary | ICD-10-CM | POA: Diagnosis not present

## 2016-11-20 DIAGNOSIS — M6281 Muscle weakness (generalized): Secondary | ICD-10-CM

## 2016-11-20 NOTE — Therapy (Signed)
[facility]ORTS MEDICINE [address], [place], [address] Phone: [phone]   Fax:  [phone]  Physical Therapy Treatment  Patient Details  Name: Ashley Olson MRN: [id number] Date of Birth: 02-25-1985 Referring Provider: Roosvelt Maser  Encounter Date: 11/20/2016      PT End of Session - 11/20/16 1358    Visit Number 4   Number of Visits 17   Date for PT Re-Evaluation 12/26/16   PT Start Time 1348   PT Stop Time 1427   PT Time Calculation (min) 39 min   Activity Tolerance Patient tolerated treatment well;Patient limited by pain   Behavior During Therapy [facility] for tasks assessed/performed      Past Medical History:  Diagnosis Date  . Morbid obesity (HCC)   . Sciatic pain     Past Surgical History:  Procedure Laterality Date  . APPENDECTOMY      There were no vitals filed for this visit.      Subjective Assessment - 11/20/16 1356    Subjective Patient reports she was feeling much better over the weekend, walked for 2-3 hours and didn't feel anything while she was walking but had some L leg weakness and back pain afterwards.    Pertinent History Pt reports that she has had chronic low back pain for the last 2 years. She has a prior history of DJD chronic low back pain, with L sciatica, and possible bulging disc. No known trauma. She woke up one morning with severe pain and states that she couldn't get out of bed. She went to the ED and was ultimately referred for physical therapy She had a bout of physical therapy for 1 month which made a significant improvement in her pain. She continued to have intermittent flares every few months. She has noticed an increase in her pain over the last couple months which is more persistent. No recent trauma or known cause. She has kept her exercises from the therapist a couple years ago but doesn't notice improvement with them. Pain is worse on the L side and then radiates down  the lateral and posterior aspects of her left leg. Pain extends to the back of her left knee. She reports intermittent numbness/tingling in her entire LLE down to her feet/toes. Infrequent buckling of LLE if she is walking an extended distance. Pt reports she has had radiographs but never an MRI of her spine. Chart review indicates L4/5, L5/S1 disc space narrowing. ROS negative for red flags. Denies any fevers/chills, loss of control bladder/bowel incontinence, unintentional wt gain/loss, night sweats. Pt does report recent increase in stress/anxiety. She has been on medications to help her sleep at night   How long can you sit comfortably? 1 hour   How long can you stand comfortably? 20 minutes   How long can you walk comfortably? 30 on flat ground   Diagnostic tests radiographs 06/2016: Minimal L4-5 and mild L5-S1 disc space narrowing without change, no MRI per patient   Patient Stated Goals "Get rid of my pain." "Walk and do things that I enjoy without pain." Pt states she would like to be able to shoot guns, play pool, and go fishing without pain   Currently in Pain? Yes  Reports discomfort in her lumbar flank bilaterally L>R   Pain Orientation Left;Lower   Pain Descriptors / Indicators Aching   Pain Type Chronic pain   Pain Onset More than a month ago   Pain Frequency Constant  Provided continuous E-stim 90V continuous to L flank, 75V for High volt stimulation with moist hot pack applied as well. X 15 minutes -- patient reported less pain after completion, though some still noted in distal lumbar flank bilaterally   Performed soft tissue mobilization to bilateral flank musculature with multiple trigger points identified, performed effleurage and trigger point ischemic holds, moreso on L musculature than R as there were more areas she reported as painful throughout. Patient reported minimal to no pain after completion.                           PT Education -  11/20/16 1358    Education provided Yes   Education Details Will continue to work on pain control before exercise therapy.    Person(s) Educated Patient   Methods Explanation;Demonstration   Comprehension Verbalized understanding;Returned demonstration             PT Long Term Goals - 11/14/16 1336      PT LONG TERM GOAL #1   Title Pt will be independent with HEP in order to improve strength and decrease pain in order to improve pain-free function at home and work with less pain.   Time 6   Period Weeks   Status New   Target Date 12/26/16     PT LONG TERM GOAL #2   Title Pt will decrease mODI scoreby at least 13 points in order demonstrate clinically significant reduction in pain/disability   Baseline 11/14/16: 52%   Time 6   Period Weeks   Status New   Target Date 12/26/16     PT LONG TERM GOAL #3   Title Pt will report worst low back pain as 8/10 on NPRS in order to demonstrate clinically significant reduction in pain   Baseline 11/14/16: worst: 10/10   Time 6   Period Weeks   Status New   Target Date 12/26/16               Plan - 11/20/16 1358    Clinical Impression Statement Patient reports positive response to soft tissue therapy and high volt stimulation/hot pack over lumbar spine/flank musculature. She has been able to ambulate further without discomfort over the weekend, though she did have residual symptoms. She would benefit from continued use of modalities for pain control until she can tolerate ther-ex to develop LE strength/core strength.    Clinical Presentation Stable   Clinical Decision Making High   Rehab Potential Fair   Clinical Impairments Affecting Rehab Potential Patient's age, motivation, and prior history of benefit from PT are all positive prognostic indications. However the distal radicular nature of her symptoms including sensory deficits and weakness, her job requirements, the chronicity of her symptoms, and her morbid obesity are all  negative prognostic indicators for being successful with conservative measures. Pt will benefit from skilled PT services to address deficits in pain, mobility, and strength in order to improve her function at home and work with less pain.    PT Frequency 2x / week   PT Duration 6 weeks   PT Treatment/Interventions ADLs/Self Care Home Management;Aquatic Therapy;Biofeedback;Cryotherapy;Electrical Stimulation;Iontophoresis /ml Dexamethasone;Moist Heat;Traction;Ultrasound;DME Instruction;Gait training;Stair training;Functional mobility training;Therapeutic exercise;Therapeutic activities;Balance training;Neuromuscular re-education;Patient/family education;Manual techniques;Passive range of motion;Dry needling;Energy conservation   PT Next Visit Plan progress pain control modalities in order to progress ROM and strengthening with less pain. Chronic pain education   PT Home Exercise Plan Supine isometric clamshells    Consulted and Agree  with Plan of Care Patient      Patient will benefit from skilled therapeutic intervention in order to improve the following deficits and impairments:  Abnormal gait, Difficulty walking, Decreased strength, Pain, Obesity  Visit Diagnosis: Chronic left-sided low back pain with left-sided sciatica  Muscle weakness (generalized)  Difficulty in walking, not elsewhere classified     Problem List Patient Active Problem List   Diagnosis Date Noted  . Lumbar degenerative disc disease 05/23/2016  . 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 Depression, recurrent (HCC) 04/18/2016  . GAD (generalized anxiety disorder) 04/18/2016  . Insomnia 04/18/2016  . Elevated BP without diagnosis of hypertension 04/18/2016  . Microcytic anemia 04/18/2016  . Morbid obesity (HCC) 03/10/2014  . Tobacco abuse 03/10/2014   Alva Garnet PT, DPT, CSCS    11/20/2016, 2:39 PM  [place] [facility] PHYSICAL AND SPORTS MEDICINE [address] [place], [address] Phone: [phone]   Fax:  [phone]  Name: CARYN GIENGER MRN: [id number] Date of Birth: 10/23/1985

## 2016-11-20 NOTE — Patient Instructions (Addendum)
Provided continuous E-stim 90V continuous to L flank, 75V for High volt stimulation with moist hot pack applied as well.

## 2016-11-23 ENCOUNTER — Ambulatory Visit: Payer: 59

## 2016-11-23 DIAGNOSIS — G8929 Other chronic pain: Secondary | ICD-10-CM

## 2016-11-23 DIAGNOSIS — M6281 Muscle weakness (generalized): Secondary | ICD-10-CM

## 2016-11-23 DIAGNOSIS — M5442 Lumbago with sciatica, left side: Principal | ICD-10-CM

## 2016-11-23 NOTE — Therapy (Signed)
[place] PH[facility]ORTS MEDICINE [address], [place], [address] Phone: [phone]   Fax:  [phone]  Physical Therapy Treatment  Patient Details  Name: Ashley Olson MRN: [id number] Date of Birth: 08-18-1985 Referring Provider: Roosvelt Maser  Encounter Date: 11/23/2016      PT End of Session - 11/23/16 1426    Visit Number 5   Number of Visits 17   Date for PT Re-Evaluation 12/26/16   PT Start Time 1117   PT Stop Time 1201   PT Time Calculation (min) 44 min   Activity Tolerance Patient tolerated treatment well;Patient limited by pain   Behavior During Therapy [facility] for tasks assessed/performed      Past Medical History:  Diagnosis Date  . Morbid obesity (HCC)   . Sciatic pain     Past Surgical History:  Procedure Laterality Date  . APPENDECTOMY      There were no vitals filed for this visit.      Subjective Assessment - 11/23/16 1425    Subjective Pt states that she feels like her back pain is improving with therapy. She continues to report pain as well as feelings of LLE weakness. No specific questions or concerns at this time.    Pertinent History Pt reports that she has had chronic low back pain for the last 2 years. She has a prior history of DJD chronic low back pain, with L sciatica, and possible bulging disc. No known trauma. She woke up one morning with severe pain and states that she couldn't get out of bed. She went to the ED and was ultimately referred for physical therapy She had a bout of physical therapy for 1 month which made a significant improvement in her pain. She continued to have intermittent flares every few months. She has noticed an increase in her pain over the last couple months which is more persistent. No recent trauma or known cause. She has kept her exercises from the therapist a couple years ago but doesn't notice improvement with them. Pain is worse on the L side and then radiates  down the lateral and posterior aspects of her left leg. Pain extends to the back of her left knee. She reports intermittent numbness/tingling in her entire LLE down to her feet/toes. Infrequent buckling of LLE if she is walking an extended distance. Pt reports she has had radiographs but never an MRI of her spine. Chart review indicates L4/5, L5/S1 disc space narrowing. ROS negative for red flags. Denies any fevers/chills, loss of control bladder/bowel incontinence, unintentional wt gain/loss, night sweats. Pt does report recent increase in stress/anxiety. She has been on medications to help her sleep at night   How long can you sit comfortably? 1 hour   How long can you stand comfortably? 20 minutes   How long can you walk comfortably? 30 on flat ground   Diagnostic tests radiographs 06/2016: Minimal L4-5 and mild L5-S1 disc space narrowing without change, no MRI per patient   Patient Stated Goals "Get rid of my pain." "Walk and do things that I enjoy without pain." Pt states she would like to be able to shoot guns, play pool, and go fishing without pain   Currently in Pain? Yes   Pain Score 4    Pain Location Back   Pain Orientation Left;Lower   Pain Descriptors / Indicators Aching   Pain Type Chronic pain   Pain Onset More than a month ago   Pain  Frequency Constant        TREATMENT  E-stim Provided HiVolt 95V to bilateral lumbar paraspinals with moist hot pack applied as well x 15 minutes, patient reported less pain after completion;  Manual Therapy Performed soft tissue mobilization to bilateral flank musculature with multiple trigger points identified, performed effleurage and trigger point ischemic holds, moreso on L musculature than R. Patient reported minimal to no pain after completion.   There-ex Supine isometric clamshells with manual resistance 5s hold x 10; Supine isometric L hip abduction with manual resistance 5s hold x 10; Extensive education with spine model answering  patient's questions regarding DDD, DJD; No pain reported during exercises                        PT Education - 11/23/16 1426    Education provided Yes   Education Details Plan of care   Person(s) Educated Patient   Methods Explanation   Comprehension Verbalized understanding             PT Long Term Goals - 11/14/16 1336      PT LONG TERM GOAL #1   Title Pt will be independent with HEP in order to improve strength and decrease pain in order to improve pain-free function at home and work with less pain.   Time 6   Period Weeks   Status New   Target Date 12/26/16     PT LONG TERM GOAL #2   Title Pt will decrease mODI scoreby at least 13 points in order demonstrate clinically significant reduction in pain/disability   Baseline 11/14/16: 52%   Time 6   Period Weeks   Status New   Target Date 12/26/16     PT LONG TERM GOAL #3   Title Pt will report worst low back pain as 8/10 on NPRS in order to demonstrate clinically significant reduction in pain   Baseline 11/14/16: worst: 10/10   Time 6   Period Weeks   Status New   Target Date 12/26/16               Plan - 11/23/16 1427    Clinical Impression Statement Pt reports significant improvement in low back pain following STM and estim to lumbar spine. She reports gradual improvement in her symptoms and is encouraged. Performed gentle hip isometrics today without increase in pain. Pt encouraged to continue HEP and follow-up as schedule.    Rehab Potential Fair   Clinical Impairments Affecting Rehab Potential Patient's age, motivation, and prior history of benefit from PT are all positive prognostic indications. However the distal radicular nature of her symptoms including sensory deficits and weakness, her job requirements, the chronicity of her symptoms, and her morbid obesity are all negative prognostic indicators for being successful with conservative measures. Pt will benefit from skilled PT services  to address deficits in pain, mobility, and strength in order to improve her function at home and work with less pain.    PT Frequency 2x / week   PT Duration 6 weeks   PT Treatment/Interventions ADLs/Self Care Home Management;Aquatic Therapy;Biofeedback;Cryotherapy;Electrical Stimulation;Iontophoresis 4mg /ml Dexamethasone;Moist Heat;Traction;Ultrasound;DME Instruction;Gait training;Stair training;Functional mobility training;Therapeutic exercise;Therapeutic activities;Balance training;Neuromuscular re-education;Patient/family education;Manual techniques;Passive range of motion;Dry needling;Energy conservation   PT Next Visit Plan progress pain control modalities in order to progress ROM and strengthening with less pain. Chronic pain education   PT Home Exercise Plan Supine isometric clamshells    Consulted and Agree with Plan of Care Patient  Patient will benefit from skilled therapeutic intervention in order to improve the following deficits and impairments:  Abnormal gait, Difficulty walking, Decreased strength, Pain, Obesity  Visit Diagnosis: Chronic left-sided low back pain with left-sided sciatica  Muscle weakness (generalized)     Problem List Patient Active Problem List   Diagnosis Date Noted  . Lumbar degenerative disc disease 05/23/2016  . 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 Depression, recurrent (HCC) 04/18/2016  . GAD (generalized anxiety disorder) 04/18/2016  . Insomnia 04/18/2016  . Elevated BP without diagnosis of hypertension 04/18/2016  . Microcytic anemia 04/18/2016  . Morbid obesity (HCC) 03/10/2014  . Tobacco abuse 03/10/2014   Ashley Olson PT, DPT   Huprich,Jason 11/23/2016, 2:42 PM  [place] [facility] PHYSICAL AND SPORTS MEDICINE [address], [place], [address] Phone: [phone]   Fax:  [phone]  Name: Ashley Olson MRN: [id number] Date of Birth: Mar 15, 1985

## 2016-11-28 ENCOUNTER — Ambulatory Visit: Payer: 59 | Admitting: Physical Therapy

## 2016-11-28 DIAGNOSIS — M5442 Lumbago with sciatica, left side: Secondary | ICD-10-CM | POA: Diagnosis not present

## 2016-11-28 DIAGNOSIS — G8929 Other chronic pain: Secondary | ICD-10-CM

## 2016-11-28 DIAGNOSIS — M6281 Muscle weakness (generalized): Secondary | ICD-10-CM

## 2016-11-28 DIAGNOSIS — R262 Difficulty in walking, not elsewhere classified: Secondary | ICD-10-CM

## 2016-11-28 NOTE — Therapy (Signed)
[facility] [facility]ICINE [address] [place], [place], [address] Phone: [phone]   Fax:  [phone]  Physical Therapy Treatment  Patient Details  Name: Ashley Olson MRN: [id number] Date of Birth: 06/13/1985 Referring Provider: Roosvelt MaserLane, Rachel  Encounter Date: 11/28/2016      PT End of Session - 11/28/16 1547    Visit Number 6   Number of Visits 17   Date for PT Re-Evaluation 12/26/16   PT Start Time 1445   PT Stop Time 1530   PT Time Calculation (min) 45 min   Activity Tolerance Patient tolerated treatment well   Behavior [facility] Therapy [facility] for tasks assessed/performed      Past Medical History:  Diagnosis Date  . Morbid obesity (HCC)   . Sciatic pain     Past Surgical History:  Procedure Laterality Date  . APPENDECTOMY      There were no vitals filed for this visit.      Subjective Assessment - 11/28/16 1542    Subjective Pt states that she continues to have less back pain.  She rates her LBP as "2" out of 10 today on 0-10 pain scale.   Pertinent History Pt reports that she has had chronic low back pain for the last 2 years. She has a prior history of DJD chronic low back pain, with L sciatica, and possible bulging disc. No known trauma. She woke up one morning with severe pain and states that she couldn't get out of bed. She went to the ED and was ultimately referred for physical therapy She had a bout of physical therapy for 1 month which made a significant improvement in her pain. She continued to have intermittent flares every few months. She has noticed an increase in her pain over the last couple months which is more persistent. No recent trauma or known cause. She has kept her exercises from the therapist a couple years ago but doesn't notice improvement with them. Pain is worse on the L side and then radiates down the lateral and posterior aspects of her left leg. Pain extends to the back of her left knee.  She reports intermittent numbness/tingling in her entire LLE down to her feet/toes. Infrequent buckling of LLE if she is walking an extended distance. Pt reports she has had radiographs but never an MRI of her spine. Chart review indicates L4/5, L5/S1 disc space narrowing. ROS negative for red flags. Denies any fevers/chills, loss of control bladder/bowel incontinence, unintentional wt gain/loss, night sweats. Pt does report recent increase in stress/anxiety. She has been on medications to help her sleep at night   How long can you sit comfortably? 1 hour   How long can you stand comfortably? 20 minutes   How long can you walk comfortably? 30 on flat ground   Diagnostic tests radiographs 06/2016: Minimal L4-5 and mild L5-S1 disc space narrowing without change, no MRI per patient   Patient Stated Goals "Get rid of my pain." "Walk and do things that I enjoy without pain." Pt states she would like to be able to shoot guns, play pool, and go fishing without pain   Currently in Pain? Yes   Pain Score 2    Pain Location Back   Pain Orientation Lower;Left   Pain Descriptors / Indicators Aching   Pain Type Chronic pain   Pain Onset More than a month ago   Pain Frequency Constant  [facility] Adult PT Treatment/Exercise - 11/28/16 0001      Exercises   Exercises Knee/Hip     Knee/Hip Exercises: Seated   Other Seated Knee/Hip Exercises Seated clamshells/hip abd/ER with RTB around thighs 2x10     Modalities   Modalities Electrical Stimulation     Electrical Stimulation   Electrical Stimulation Location --  Seated IFC to L/S with MHP x 15 min     Manual Therapy   Manual Therapy Soft tissue mobilization   Soft tissue mobilization Effleurage and petrissage to L/S paraspinals- trigger point release                     PT Long Term Goals - 11/14/16 1336      PT LONG TERM GOAL #1   Title Pt will be independent with HEP in order to improve strength  and decrease pain in order to improve pain-free function at home and work with less pain.   Time 6   Period Weeks   Status New   Target Date 12/26/16     PT LONG TERM GOAL #2   Title Pt will decrease mODI scoreby at least 13 points in order demonstrate clinically significant reduction in pain/disability   Baseline 11/14/16: 52%   Time 6   Period Weeks   Status New   Target Date 12/26/16     PT LONG TERM GOAL #3   Title Pt will report worst low back pain as 8/10 on NPRS in order to demonstrate clinically significant reduction in pain   Baseline 11/14/16: worst: 10/10   Time 6   Period Weeks   Status New   Target Date 12/26/16               Plan - 11/28/16 1548    Clinical Impression Statement Pt reports improved LBP following STM and e-stim to L/S.  Advance LE and core ther ex's as tolerated at next visit.   Clinical Presentation Stable   Clinical Decision Making High   Rehab Potential Fair   Clinical Impairments Affecting Rehab Potential Patient's age, motivation, and prior history of benefit from PT are all positive prognostic indications. However the distal radicular nature of her symptoms including sensory deficits and weakness, her job requirements, the chronicity of her symptoms, and her morbid obesity are all negative prognostic indicators for being successful with conservative measures. Pt will benefit from skilled PT services to address deficits in pain, mobility, and strength in order to improve her function at home and work with less pain.    PT Frequency 2x / week   PT Duration 6 weeks   PT Treatment/Interventions ADLs/Self Care Home Management;Aquatic Therapy;Biofeedback;Cryotherapy;Electrical Stimulation;Iontophoresis 4mg /ml Dexamethasone;Moist Heat;Traction;Ultrasound;DME Instruction;Gait training;Stair training;Functional mobility training;Therapeutic exercise;Therapeutic activities;Balance training;Neuromuscular re-education;Patient/family education;Manual  techniques;Passive range of motion;Dry needling;Energy conservation   PT Next Visit Plan progress pain control modalities in order to progress ROM and strengthening with less pain. Chronic pain education   PT Home Exercise Plan Supine isometric clamshells    Consulted and Agree with Plan of Care Patient      Patient will benefit from skilled therapeutic intervention in order to improve the following deficits and impairments:  Abnormal gait, Difficulty walking, Decreased strength, Pain, Obesity  Visit Diagnosis: Chronic left-sided low back pain with left-sided sciatica  Muscle weakness (generalized)  Difficulty in walking, not elsewhere classified     Problem List Patient Active Problem List   Diagnosis Date Noted  . Lumbar degenerative disc disease 05/23/2016  . Chronic  low back pain with left-sided sciatica 05/23/2016  . Depression, recurrent (HCC) 04/18/2016  . GAD (generalized anxiety disorder) 04/18/2016  . Insomnia 04/18/2016  . Elevated BP without diagnosis of hypertension 04/18/2016  . Microcytic anemia 04/18/2016  . Morbid obesity (HCC) 03/10/2014  . Tobacco abuse 03/10/2014    Marcelles Clinard, MPT 11/28/2016, 3:51 PM  [place] [facility] PHYS[facility] SPORTS MEDICINE [address] [place], [address] Phone: [phone]   Fax:  [phone]  Name: MELENIE MINNIEAR MRN: [id number] Date of Birth: 1985/05/23

## 2016-11-30 ENCOUNTER — Telehealth: Payer: Self-pay | Admitting: Family Medicine

## 2016-11-30 NOTE — Telephone Encounter (Signed)
Form given to MizeRachel.

## 2016-11-30 NOTE — Telephone Encounter (Signed)
Please call and ask what she's needing the FMLA forms for (assuming her back pain for which she's undergoing PT for). Also need to know how many days of work she is missing as we did not discuss this during her establish visit

## 2016-11-30 NOTE — Telephone Encounter (Signed)
Paperwork completed and given to [employer]Keri

## 2016-11-30 NOTE — Telephone Encounter (Signed)
Called and spoke to patient. She states that the paperwork is for her back pain that she is going to therapy for. She states that she has not been out of work but has had to leave to go to therapy for an hour each time. She states that she has been going to therapy twice a week, and hour each visit. Needs paperwork faxed to [employer] at [phone], Attn: Joslyn HyAllison Murphy.

## 2016-11-30 NOTE — Telephone Encounter (Signed)
Patient dropped off FMLA forms to be filled out by [employer] ASAP.   [phone]  Thanks

## 2016-12-01 NOTE — Telephone Encounter (Signed)
Patient has one portion to fill out.  Called patient and LVM for her to return my call.

## 2016-12-01 NOTE — Telephone Encounter (Signed)
Form faxed and confirmed for patient.

## 2016-12-04 ENCOUNTER — Ambulatory Visit (INDEPENDENT_AMBULATORY_CARE_PROVIDER_SITE_OTHER): Payer: 59 | Admitting: Family Medicine

## 2016-12-04 ENCOUNTER — Encounter: Payer: Self-pay | Admitting: Family Medicine

## 2016-12-04 VITALS — BP 124/74 | HR 76 | Temp 97.4°F | Ht 71.5 in | Wt 379.8 lb

## 2016-12-04 DIAGNOSIS — Z0000 Encounter for general adult medical examination without abnormal findings: Secondary | ICD-10-CM | POA: Diagnosis not present

## 2016-12-04 DIAGNOSIS — M5136 Other intervertebral disc degeneration, lumbar region: Secondary | ICD-10-CM | POA: Diagnosis not present

## 2016-12-04 DIAGNOSIS — Z23 Encounter for immunization: Secondary | ICD-10-CM

## 2016-12-04 LAB — MICROSCOPIC EXAMINATION: BACTERIA UA: NONE SEEN

## 2016-12-04 LAB — UA/M W/RFLX CULTURE, ROUTINE
Bilirubin, UA: NEGATIVE
Glucose, UA: NEGATIVE
LEUKOCYTES UA: NEGATIVE
Nitrite, UA: NEGATIVE
PH UA: 6 (ref 5.0–7.5)
RBC, UA: NEGATIVE
SPEC GRAV UA: 1.025 (ref 1.005–1.030)
Urobilinogen, Ur: 0.2 mg/dL (ref 0.2–1.0)

## 2016-12-04 MED ORDER — BUSPIRONE HCL 10 MG PO TABS: ORAL_TABLET | ORAL | 1 refills | Status: DC

## 2016-12-04 MED ORDER — HYDROXYZINE HCL 25 MG PO TABS: 25.0000 mg | ORAL_TABLET | Freq: Three times a day (TID) | ORAL | 1 refills | Status: DC | PRN

## 2016-12-04 MED ORDER — CYCLOBENZAPRINE HCL 10 MG PO TABS: 10.0000 mg | ORAL_TABLET | Freq: Three times a day (TID) | ORAL | 2 refills | Status: DC | PRN

## 2016-12-04 MED ORDER — SULFAMETHOXAZOLE-TRIMETHOPRIM 800-160 MG PO TABS: 1.0000 | ORAL_TABLET | Freq: Two times a day (BID) | ORAL | 0 refills | Status: DC

## 2016-12-04 NOTE — Patient Instructions (Signed)
Tdap Vaccine (Tetanus, Diphtheria and Pertussis): What You Need to Know 1. Why get vaccinated? Tetanus, diphtheria and pertussis are very serious diseases. Tdap vaccine can protect us from these diseases. And, Tdap vaccine given to pregnant women can protect newborn babies against pertussis. TETANUS (Lockjaw) is rare in the [place] today. It causes painful muscle tightening and stiffness, usually all over the body.  It can lead to tightening of muscles in the head and neck so you can't open your mouth, swallow, or sometimes even breathe. Tetanus kills about 1 out of 10 people who are infected even after receiving the best medical care.  DIPHTHERIA is also rare in the [place] today. It can cause a thick coating to form in the back of the throat.  It can lead to breathing problems, heart failure, paralysis, and death.  PERTUSSIS (Whooping Cough) causes severe coughing spells, which can cause difficulty breathing, vomiting and disturbed sleep.  It can also lead to weight loss, incontinence, and rib fractures. Up to 2 in 100 adolescents and 5 in 100 adults with pertussis are hospitalized or have complications, which could include pneumonia or death.  These diseases are caused by bacteria. Diphtheria and pertussis are spread from person to person through secretions from coughing or sneezing. Tetanus enters the body through cuts, scratches, or wounds. Before vaccines, as many as 200,000 cases of diphtheria, 200,000 cases of pertussis, and hundreds of cases of tetanus, were reported in the [place] each year. Since vaccination began, reports of cases for tetanus and diphtheria have dropped by about 99% and for pertussis by about 80%. 2. Tdap vaccine Tdap vaccine can protect adolescents and adults from tetanus, diphtheria, and pertussis. One dose of Tdap is routinely given at age 11 or 12. People who did not get Tdap at that age should get it as soon as possible. Tdap is especially  important for healthcare professionals and anyone having close contact with a baby younger than 12 months. Pregnant women should get a dose of Tdap during every pregnancy, to protect the newborn from pertussis. Infants are most at risk for severe, life-threatening complications from pertussis. Another vaccine, called Td, protects against tetanus and diphtheria, but not pertussis. A Td booster should be given every 10 years. Tdap may be given as one of these boosters if you have never gotten Tdap before. Tdap may also be given after a severe cut or burn to prevent tetanus infection. Your doctor or the person giving you the vaccine can give you more information. Tdap may safely be given at the same time as other vaccines. 3. Some people should not get this vaccine  A person who has ever had a life-threatening allergic reaction after a previous dose of any diphtheria, tetanus or pertussis containing vaccine, OR has a severe allergy to any part of this vaccine, should not get Tdap vaccine. Tell the person giving the vaccine about any severe allergies.  Anyone who had coma or long repeated seizures within 7 days after a childhood dose of DTP or DTaP, or a previous dose of Tdap, should not get Tdap, unless a cause other than the vaccine was found. They can still get Td.  Talk to your doctor if you: ? have seizures or another nervous system problem, ? had severe pain or swelling after any vaccine containing diphtheria, tetanus or pertussis, ? ever had a condition called Guillain-Barr Syndrome (GBS), ? aren't feeling well on the day the shot is scheduled. 4. Risks With any medicine, including   vaccines, there is a chance of side effects. These are usually mild and go away on their own. Serious reactions are also possible but are rare. Most people who get Tdap vaccine do not have any problems with it. Mild problems following Tdap: (Did not interfere with activities)  Pain where the shot was given (about  3 in 4 adolescents or 2 in 3 adults)  Redness or swelling where the shot was given (about 1 person in 5)  Mild fever of at least 100.4F (up to about 1 in 25 adolescents or 1 in 100 adults)  Headache (about 3 or 4 people in 10)  Tiredness (about 1 person in 3 or 4)  Nausea, vomiting, diarrhea, stomach ache (up to 1 in 4 adolescents or 1 in 10 adults)  Chills, sore joints (about 1 person in 10)  Body aches (about 1 person in 3 or 4)  Rash, swollen glands (uncommon)  Moderate problems following Tdap: (Interfered with activities, but did not require medical attention)  Pain where the shot was given (up to 1 in 5 or 6)  Redness or swelling where the shot was given (up to about 1 in 16 adolescents or 1 in 12 adults)  Fever over 102F (about 1 in 100 adolescents or 1 in 250 adults)  Headache (about 1 in 7 adolescents or 1 in 10 adults)  Nausea, vomiting, diarrhea, stomach ache (up to 1 or 3 people in 100)  Swelling of the entire arm where the shot was given (up to about 1 in 500).  Severe problems following Tdap: (Unable to perform usual activities; required medical attention)  Swelling, severe pain, bleeding and redness in the arm where the shot was given (rare).  Problems that could happen after any vaccine:  People sometimes faint after a medical procedure, including vaccination. Sitting or lying down for about 15 minutes can help prevent fainting, and injuries caused by a fall. Tell your doctor if you feel dizzy, or have vision changes or ringing in the ears.  Some people get severe pain in the shoulder and have difficulty moving the arm where a shot was given. This happens very rarely.  Any medication can cause a severe allergic reaction. Such reactions from a vaccine are very rare, estimated at fewer than 1 in a million doses, and would happen within a few minutes to a few hours after the vaccination. As with any medicine, there is a very remote chance of a vaccine  causing a serious injury or death. The safety of vaccines is always being monitored. For more information, visit: www.cdc.gov/vaccinesafety/ 5. What if there is a serious problem? What should I look for? Look for anything that concerns you, such as signs of a severe allergic reaction, very high fever, or unusual behavior. Signs of a severe allergic reaction can include hives, swelling of the face and throat, difficulty breathing, a fast heartbeat, dizziness, and weakness. These would usually start a few minutes to a few hours after the vaccination. What should I do?  If you think it is a severe allergic reaction or other emergency that can't wait, call 9-1-1 or get the person to the nearest hospital. Otherwise, call your doctor.  Afterward, the reaction should be reported to the Vaccine Adverse Event Reporting System (VAERS). Your doctor might file this report, or you can do it yourself through the VAERS web site at www.vaers.hhs.gov, or by calling 1-800-822-7967. ? VAERS does not give medical advice. 6. The National Vaccine Injury Compensation Program The National   Vaccine Injury Compensation Program (VICP) is a federal program that was created to compensate people who may have been injured by certain vaccines. Persons who believe they may have been injured by a vaccine can learn about the program and about filing a claim by calling 1-(872)652-3160 or visiting the VICP website at SpiritualWord.at. There is a time limit to file a claim for compensation. 7. How can I learn more?  Ask your doctor. He or she can give you the vaccine package insert or suggest other sources of information.  Call your local or state health department.  Contact the Centers for Disease Control and Prevention (CDC): ? Call 662-135-0327 (1-800-CDC-INFO) or ? Visit CDC's website at PicCapture.uy CDC Tdap Vaccine VIS (04/01/13) This information is not intended to replace advice given to you by your  health care provider. Make sure you discuss any questions you have with your health care provider. Document Released: 07/25/2011 Document Revised: 10/14/2015 Document Reviewed: 10/14/2015 Elsevier Interactive Patient Education  2017 [employer]. Tdap Vaccine (Tetanus, Diphtheria and Pertussis): What You Need to Know 1. Why get vaccinated? Tetanus, diphtheria and pertussis are very serious diseases. Tdap vaccine can protect [place] from these diseases. And, Tdap vaccine given to pregnant women can protect newborn babies against pertussis. TETANUS (Lockjaw) is rare in the [place] today. It causes painful muscle tightening and stiffness, usually all over the body.  It can lead to tightening of muscles in the head and neck so you can't open your mouth, swallow, or sometimes even breathe. Tetanus kills about 1 out of 10 people who are infected even after receiving the best medical care.  DIPHTHERIA is also rare in the [place] today. It can cause a thick coating to form in the back of the throat.  It can lead to breathing problems, heart failure, paralysis, and death.  PERTUSSIS (Whooping Cough) causes severe coughing spells, which can cause difficulty breathing, vomiting and disturbed sleep.  It can also lead to weight loss, incontinence, and rib fractures. Up to 2 in 100 adolescents and 5 in 100 adults with pertussis are hospitalized or have complications, which could include pneumonia or death.  These diseases are caused by bacteria. Diphtheria and pertussis are spread from person to person through secretions from coughing or sneezing. Tetanus enters the body through cuts, scratches, or wounds. Before vaccines, as many as 200,000 cases of diphtheria, 200,000 cases of pertussis, and hundreds of cases of tetanus, were reported in the [place] each year. Since vaccination began, reports of cases for tetanus and diphtheria have dropped by about 99% and for pertussis by about 80%. 2. Tdap  vaccine Tdap vaccine can protect adolescents and adults from tetanus, diphtheria, and pertussis. One dose of Tdap is routinely given at age 40 or 40. People who did not get Tdap at that age should get it as soon as possible. Tdap is especially important for healthcare professionals and anyone having close contact with a baby younger than 12 months. Pregnant women should get a dose of Tdap during every pregnancy, to protect the newborn from pertussis. Infants are most at risk for severe, life-threatening complications from pertussis. Another vaccine, called Td, protects against tetanus and diphtheria, but not pertussis. A Td booster should be given every 10 years. Tdap may be given as one of these boosters if you have never gotten Tdap before. Tdap may also be given after a severe cut or burn to prevent tetanus infection. Your doctor or the person giving you the vaccine can  give you more information. Tdap may safely be given at the same time as other vaccines. 3. Some people should not get this vaccine  A person who has ever had a life-threatening allergic reaction after a previous dose of any diphtheria, tetanus or pertussis containing vaccine, OR has a severe allergy to any part of this vaccine, should not get Tdap vaccine. Tell the person giving the vaccine about any severe allergies.  Anyone who had coma or long repeated seizures within 7 days after a childhood dose of DTP or DTaP, or a previous dose of Tdap, should not get Tdap, unless a cause other than the vaccine was found. They can still get Td.  Talk to your doctor if you: ? have seizures or another nervous system problem, ? had severe pain or swelling after any vaccine containing diphtheria, tetanus or pertussis, ? ever had a condition called Guillain-Barr Syndrome (GBS), ? aren't feeling well on the day the shot is scheduled. 4. Risks With any medicine, including vaccines, there is a chance of side effects. These are usually mild and  go away on their own. Serious reactions are also possible but are rare. Most people who get Tdap vaccine do not have any problems with it. Mild problems following Tdap: (Did not interfere with activities)  Pain where the shot was given (about 3 in 4 adolescents or 2 in 3 adults)  Redness or swelling where the shot was given (about 1 person in 5)  Mild fever of at least 100.36F (up to about 1 in 25 adolescents or 1 in 100 adults)  Headache (about 3 or 4 people in 10)  Tiredness (about 1 person in 3 or 4)  Nausea, vomiting, diarrhea, stomach ache (up to 1 in 4 adolescents or 1 in 10 adults)  Chills, sore joints (about 1 person in 10)  Body aches (about 1 person in 3 or 4)  Rash, swollen glands (uncommon)  Moderate problems following Tdap: (Interfered with activities, but did not require medical attention)  Pain where the shot was given (up to 1 in 5 or 6)  Redness or swelling where the shot was given (up to about 1 in 16 adolescents or 1 in 12 adults)  Fever over 102F (about 1 in 100 adolescents or 1 in 250 adults)  Headache (about 1 in 7 adolescents or 1 in 10 adults)  Nausea, vomiting, diarrhea, stomach ache (up to 1 or 3 people in 100)  Swelling of the entire arm where the shot was given (up to about 1 in 500).  Severe problems following Tdap: (Unable to perform usual activities; required medical attention)  Swelling, severe pain, bleeding and redness in the arm where the shot was given (rare).  Problems that could happen after any vaccine:  People sometimes faint after a medical procedure, including vaccination. Sitting or lying down for about 15 minutes can help prevent fainting, and injuries caused by a fall. Tell your doctor if you feel dizzy, or have vision changes or ringing in the ears.  Some people get severe pain in the shoulder and have difficulty moving the arm where a shot was given. This happens very rarely.  Any medication can cause a severe allergic  reaction. Such reactions from a vaccine are very rare, estimated at fewer than 1 in a million doses, and would happen within a few minutes to a few hours after the vaccination. As with any medicine, there is a very remote chance of a vaccine causing a serious injury or  death. The safety of vaccines is always being monitored. For more information, visit: http://floyd.org/ 5. What if there is a serious problem? What should I look for? Look for anything that concerns you, such as signs of a severe allergic reaction, very high fever, or unusual behavior. Signs of a severe allergic reaction can include hives, swelling of the face and throat, difficulty breathing, a fast heartbeat, dizziness, and weakness. These would usually start a few minutes to a few hours after the vaccination. What should I do?  If you think it is a severe allergic reaction or other emergency that can't wait, call 9-1-1 or get the person to the nearest hospital. Otherwise, call your doctor.  Afterward, the reaction should be reported to the Vaccine Adverse Event Reporting System (VAERS). Your doctor might file this report, or you can do it yourself through the VAERS web site at www.vaers.LAgents.no, or by calling 1-(346)833-4776. ? VAERS does not give medical advice. 6. The National Vaccine Injury Compensation Program The [employer] Vaccine Injury Compensation Program (VICP) is a federal program that was created to compensate people who may have been injured by certain vaccines. Persons who believe they may have been injured by a vaccine can learn about the program and about filing a claim by calling 1-253-459-5783 or visiting the VICP website at SpiritualWord.at. There is a time limit to file a claim for compensation. 7. How can I learn more?  Ask your doctor. He or she can give you the vaccine package insert or suggest other sources of information.  Call your local or state health department.  Contact the  Centers for Disease Control and Prevention (CDC): ? Call 414-880-1633 (1-800-CDC-INFO) or ? Visit CDC's website at PicCapture.uy CDC Tdap Vaccine VIS (04/01/13) This information is not intended to replace advice given to you by your health care provider. Make sure you discuss any questions you have with your health care provider. Document Released: 07/25/2011 Document Revised: 10/14/2015 Document Reviewed: 10/14/2015 Elsevier Interactive Patient Education  2017 [employer].

## 2016-12-04 NOTE — Progress Notes (Signed)
BP 124/74 (BP Location: Right Wrist, Patient Position: Sitting, Cuff Size: Large)   Pulse 76   Temp (!) 97.4 F (36.3 C)   Ht 5' 11.5" (1.816 m)   Wt (!) 379 lb 12.8 oz (172.3 kg)   SpO2 99%   BMI 52.23 kg/m    Subjective:    Patient ID: Ashley Olson, female    DOB: 1985/08/03, 31 y.o.   MRN: [id number]  HPI: Ashley Olson is a 31 y.o. female presenting on 12/04/2016 for comprehensive medical examination. Current medical complaints include:none  Was supposed to be following up on several conditions today but has not yet started any of her new medications given about a month ago. Will hold off on that visit until a month after starting these medicines.   Depression Screen done today and results listed below:  Depression screen [facility] 2/9 05/23/2016 04/18/2016  Decreased Interest 2 3  Down, Depressed, Hopeless 3 2  PHQ - 2 Score 5 5  Altered sleeping 3 2  Tired, decreased energy 2 3  Change in appetite (No Data) 2  Feeling bad or failure about yourself  2 2  Trouble concentrating 2 2  Moving slowly or fidgety/restless 2 3  Suicidal thoughts 0 0  PHQ-9 Score 16 19  Difficult doing work/chores Somewhat difficult -    The patient does not have a history of falls. I did not complete a risk assessment for falls. A plan of care for falls was not documented.   Past Medical History:  Past Medical History:  Diagnosis Date  . Morbid obesity (HCC)   . Sciatic pain     Surgical History:  Past Surgical History:  Procedure Laterality Date  . APPENDECTOMY      Medications:  Current Outpatient Prescriptions on File Prior to Visit  Medication Sig  . meloxicam (MOBIC) 15 MG tablet Take 1 tablet (15 mg total) by mouth daily. With food, for 2-4 weeks as needed for back pain  . ibuprofen (ADVIL,MOTRIN) 800 MG tablet Take 1 tablet (800 mg total) by mouth every 8 (eight) hours as needed. (Patient not taking: Reported on 11/14/2016)   No current facility-administered medications  on file prior to visit.     Allergies:  Allergies  Allergen Reactions  . Amoxicillin Swelling    Social History:  Social History   Social History  . Marital status: Single    Spouse name: N/A  . Number of children: N/A  . Years of education: N/A   Occupational History  . Not on file.   Social History Main Topics  . Smoking status: Current Every Day Smoker    Packs/day: 0.20    Types: Cigarettes  . Smokeless tobacco: Never Used  . Alcohol use Yes     Comment: Occasionally  . Drug use: Yes    Types: Marijuana  . Sexual activity: Not on file   Other Topics Concern  . Not on file   Social History Narrative  . No narrative on file   History  Smoking Status  . Current Every Day Smoker  . Packs/day: 0.20  . Types: Cigarettes  Smokeless Tobacco  . Never Used   History  Alcohol Use  . Yes    Comment: Occasionally    Family History:  Family History  Problem Relation Age of Onset  . Hypertension Mother   . Diabetes Mother   . Autism Daughter   . Diabetes Maternal Grandmother   . Stroke Maternal Grandmother   .  Heart block Maternal Grandfather   . Hypertension Maternal Grandfather   . Mental illness Neg Hx     Past medical history, surgical history, medications, allergies, family history and social history reviewed with patient today and changes made to appropriate areas of the chart.   Review of Systems - General ROS: negative Psychological ROS: negative Ophthalmic ROS: negative ENT ROS: negative Breast ROS: negative for breast lumps Respiratory ROS: no cough, shortness of breath, or wheezing Cardiovascular ROS: no chest pain or dyspnea on exertion Gastrointestinal ROS: no abdominal pain, change in bowel habits, or black or bloody stools Genito-Urinary ROS: no dysuria, trouble voiding, or hematuria Musculoskeletal ROS: negative positive for chronic low back pain, currently in regular PT Neurological ROS: no TIA or stroke symptoms Dermatological ROS:  negative All other ROS negative except what is listed above and in the HPI.      Objective:    BP 124/74 (BP Location: Right Wrist, Patient Position: Sitting, Cuff Size: Large)   Pulse 76   Temp (!) 97.4 F (36.3 C)   Ht 5' 11.5" (1.816 m)   Wt (!) 379 lb 12.8 oz (172.3 kg)   SpO2 99%   BMI 52.23 kg/m   Wt Readings from Last 3 Encounters:  12/04/16 (!) 379 lb 12.8 oz (172.3 kg)  11/02/16 (!) 386 lb (175.1 kg)  05/23/16 (!) 372 lb (168.7 kg)    Physical Exam  Constitutional: She is oriented to person, place, and time. She appears well-developed and well-nourished. No distress.  HENT:  Head: Atraumatic.  Right Ear: External ear normal.  Left Ear: External ear normal.  Nose: Nose normal.  Mouth/Throat: Oropharynx is clear and moist. No oropharyngeal exudate.  Eyes: Pupils are equal, round, and reactive to light. Conjunctivae are normal. No scleral icterus.  Neck: Normal range of motion. Neck supple. No thyromegaly present.  Cardiovascular: Normal rate, regular rhythm, normal heart sounds and intact distal pulses.   Pulmonary/Chest: Effort normal and breath sounds normal. No respiratory distress. Right breast exhibits no mass, no skin change and no tenderness. Left breast exhibits no mass, no skin change and no tenderness.  Abdominal: Soft. Bowel sounds are normal. She exhibits no mass. There is no tenderness.  Musculoskeletal: Normal range of motion. She exhibits no edema or tenderness.  Lymphadenopathy:    She has no cervical adenopathy.    She has no axillary adenopathy.  Neurological: She is alert and oriented to person, place, and time. No cranial nerve deficit.  Skin: Skin is warm and dry. No rash noted.  Psychiatric: She has a normal mood and affect. Her behavior is normal.  Nursing note and vitals reviewed.   Results for orders placed or performed in visit on 12/04/16  Microscopic Examination  Result Value Ref Range   WBC, UA 0-5 0 - 5 /hpf   RBC, UA 0-2 0 - 2 /hpf     Epithelial Cells (non renal) >10 (A) 0 - 10 /hpf   Bacteria, UA None seen None seen/Few  CBC with Differential/Platelet  Result Value Ref Range   WBC 5.9 3.4 - 10.8 x10E3/uL   RBC 4.75 3.77 - 5.28 x10E6/uL   Hemoglobin 12.6 11.1 - 15.9 g/dL   Hematocrit 16.139.7 09.634.0 - 46.6 %   MCV 84 79 - 97 fL   MCH 26.5 (L) 26.6 - 33.0 pg   MCHC 31.7 31.5 - 35.7 g/dL   RDW 04.515.8 (H) 40.912.3 - 81.115.4 %   Platelets 330 150 - 379 x10E3/uL   Neutrophils  55 Not Estab. %   Lymphs 34 Not Estab. %   Monocytes 8 Not Estab. %   Eos 3 Not Estab. %   Basos 0 Not Estab. %   Neutrophils Absolute 3.2 1.4 - 7.0 x10E3/uL   Lymphocytes Absolute 2.0 0.7 - 3.1 x10E3/uL   Monocytes Absolute 0.5 0.1 - 0.9 x10E3/uL   EOS (ABSOLUTE) 0.2 0.0 - 0.4 x10E3/uL   Basophils Absolute 0.0 0.0 - 0.2 x10E3/uL   Immature Granulocytes 0 Not Estab. %   Immature Grans (Abs) 0.0 0.0 - 0.1 x10E3/uL  Comprehensive metabolic panel  Result Value Ref Range   Glucose 82 65 - 99 mg/dL   BUN 10 6 - 20 mg/dL   Creatinine, Ser 5.40 0.57 - 1.00 mg/dL   GFR calc non Af Amer 81 >59 mL/min/1.73   GFR calc Af Amer 93 >59 mL/min/1.73   BUN/Creatinine Ratio 11 9 - 23   Sodium 140 134 - 144 mmol/L   Potassium 4.6 3.5 - 5.2 mmol/L   Chloride 104 96 - 106 mmol/L   CO2 23 20 - 29 mmol/L   Calcium 9.3 8.7 - 10.2 mg/dL   Total Protein 6.9 6.0 - 8.5 g/dL   Albumin 4.0 3.5 - 5.5 g/dL   Globulin, Total 2.9 1.5 - 4.5 g/dL   Albumin/Globulin Ratio 1.4 1.2 - 2.2   Bilirubin Total <0.2 0.0 - 1.2 mg/dL   Alkaline Phosphatase 75 39 - 117 IU/L   AST 14 0 - 40 IU/L   ALT 19 0 - 32 IU/L  Lipid Panel w/o Chol/HDL Ratio  Result Value Ref Range   Cholesterol, Total 114 100 - 199 mg/dL   Triglycerides 95 0 - 149 mg/dL   HDL 65 >98 mg/dL   VLDL Cholesterol Cal 19 5 - 40 mg/dL   LDL Calculated 30 0 - 99 mg/dL  TSH  Result Value Ref Range   TSH 1.510 0.450 - 4.500 uIU/mL  UA/M w/rflx Culture, Routine  Result Value Ref Range   Specific Gravity, UA 1.025 1.005 -  1.030   pH, UA 6.0 5.0 - 7.5   Color, UA Yellow Yellow   Appearance Ur Cloudy (A) Clear   Leukocytes, UA Negative Negative   Protein, UA 1+ (A) Negative/Trace   Glucose, UA Negative Negative   Ketones, UA Trace (A) Negative   RBC, UA Negative Negative   Bilirubin, UA Negative Negative   Urobilinogen, Ur 0.2 0.2 - 1.0 mg/dL   Nitrite, UA Negative Negative   Microscopic Examination See below:       Assessment & Plan:   Problem List Items Addressed This Visit      Musculoskeletal and Integument   Lumbar degenerative disc disease - Primary    Doing very well with regular PT. FMLA forms filled out last week.       Relevant Medications   cyclobenzaprine (FLEXERIL) 10 MG tablet    Other Visit Diagnoses    Annual physical exam       Await fasting labs. UTD on health maintenanc. Refusing flu shot   Relevant Orders   CBC with Differential/Platelet (Completed)   Comprehensive metabolic panel (Completed)   Lipid Panel w/o Chol/HDL Ratio (Completed)   TSH (Completed)   UA/M w/rflx Culture, Routine (Completed)   Need for Tdap vaccination       Relevant Orders   Tdap vaccine greater than or equal to 7yo IM (Completed)       Follow up plan: Return in about 4 weeks (around 01/01/2017)  for Medication f/u.   LABORATORY TESTING:  - Pap smear: up to date  IMMUNIZATIONS:   - Tdap: Tetanus vaccination status reviewed: last tetanus booster within 10 years. - Influenza: Refused  PATIENT COUNSELING:   Advised to take 1 mg of folate supplement per day if capable of pregnancy.   Sexuality: Discussed sexually transmitted diseases, partner selection, use of condoms, avoidance of unintended pregnancy  and contraceptive alternatives.   Advised to avoid cigarette smoking.  I discussed with the patient that most people either abstain from alcohol or drink within safe limits (<=14/week and <=4 drinks/occasion for males, <=7/weeks and <= 3 drinks/occasion for females) and that the risk for  alcohol disorders and other health effects rises proportionally with the number of drinks per week and how often a drinker exceeds daily limits.  Discussed cessation/primary prevention of drug use and availability of treatment for abuse.   Diet: Encouraged to adjust caloric intake to maintain  or achieve ideal body weight, to reduce intake of dietary saturated fat and total fat, to limit sodium intake by avoiding high sodium foods and not adding table salt, and to maintain adequate dietary potassium and calcium preferably from fresh fruits, vegetables, and low-fat dairy products.    stressed the importance of regular exercise  Injury prevention: Discussed safety belts, safety helmets, smoke detector, smoking near bedding or upholstery.   Dental health: Discussed importance of regular tooth brushing, flossing, and dental visits.    NEXT PREVENTATIVE PHYSICAL DUE IN 1 YEAR. Return in about 4 weeks (around 01/01/2017) for Medication f/u.

## 2016-12-05 ENCOUNTER — Encounter: Payer: Self-pay | Admitting: Family Medicine

## 2016-12-05 LAB — COMPREHENSIVE METABOLIC PANEL
A/G RATIO: 1.4 (ref 1.2–2.2)
ALK PHOS: 75 IU/L (ref 39–117)
ALT: 19 IU/L (ref 0–32)
AST: 14 IU/L (ref 0–40)
Albumin: 4 g/dL (ref 3.5–5.5)
BUN / CREAT RATIO: 11 (ref 9–23)
BUN: 10 mg/dL (ref 6–20)
CHLORIDE: 104 mmol/L (ref 96–106)
CO2: 23 mmol/L (ref 20–29)
Calcium: 9.3 mg/dL (ref 8.7–10.2)
Creatinine, Ser: 0.95 mg/dL (ref 0.57–1.00)
GFR calc non Af Amer: 81 mL/min/{1.73_m2} (ref >59)
GFR, EST AFRICAN AMERICAN: 93 mL/min/{1.73_m2} (ref >59)
Globulin, Total: 2.9 g/dL (ref 1.5–4.5)
Glucose: 82 mg/dL (ref 65–99)
POTASSIUM: 4.6 mmol/L (ref 3.5–5.2)
Sodium: 140 mmol/L (ref 134–144)
TOTAL PROTEIN: 6.9 g/dL (ref 6.0–8.5)

## 2016-12-05 LAB — CBC WITH DIFFERENTIAL/PLATELET
BASOS ABS: 0 10*3/uL (ref 0.0–0.2)
Basos: 0 %
EOS (ABSOLUTE): 0.2 10*3/uL (ref 0.0–0.4)
EOS: 3 %
Hematocrit: 39.7 % (ref 34.0–46.6)
Hemoglobin: 12.6 g/dL (ref 11.1–15.9)
IMMATURE GRANS (ABS): 0 10*3/uL (ref 0.0–0.1)
Immature Granulocytes: 0 %
LYMPHS ABS: 2 10*3/uL (ref 0.7–3.1)
Lymphs: 34 %
MCH: 26.5 pg — AB (ref 26.6–33.0)
MCHC: 31.7 g/dL (ref 31.5–35.7)
MCV: 84 fL (ref 79–97)
MONOCYTES: 8 %
MONOS ABS: 0.5 10*3/uL (ref 0.1–0.9)
NEUTROS ABS: 3.2 10*3/uL (ref 1.4–7.0)
Neutrophils: 55 %
PLATELETS: 330 10*3/uL (ref 150–379)
RBC: 4.75 x10E6/uL (ref 3.77–5.28)
RDW: 15.8 % — AB (ref 12.3–15.4)
WBC: 5.9 10*3/uL (ref 3.4–10.8)

## 2016-12-05 LAB — TSH: TSH: 1.51 u[IU]/mL (ref 0.450–4.500)

## 2016-12-05 LAB — LIPID PANEL W/O CHOL/HDL RATIO
CHOLESTEROL TOTAL: 114 mg/dL (ref 100–199)
HDL: 65 mg/dL (ref >39)
LDL Calculated: 30 mg/dL (ref 0–99)
Triglycerides: 95 mg/dL (ref 0–149)
VLDL Cholesterol Cal: 19 mg/dL (ref 5–40)

## 2016-12-05 NOTE — Assessment & Plan Note (Signed)
Doing very well with regular PT. FMLA forms filled out last week.

## 2016-12-12 ENCOUNTER — Ambulatory Visit: Payer: 59 | Attending: Family Medicine | Admitting: Physical Therapy

## 2016-12-12 DIAGNOSIS — G8929 Other chronic pain: Secondary | ICD-10-CM

## 2016-12-12 DIAGNOSIS — M6281 Muscle weakness (generalized): Secondary | ICD-10-CM

## 2016-12-12 DIAGNOSIS — M5442 Lumbago with sciatica, left side: Secondary | ICD-10-CM | POA: Diagnosis not present

## 2016-12-12 DIAGNOSIS — R262 Difficulty in walking, not elsewhere classified: Secondary | ICD-10-CM | POA: Diagnosis present

## 2016-12-12 NOTE — Therapy (Signed)
[facility] [facility] [address], [place], [address] Phone: [phone]   Fax:  [phone]  Physical Therapy Treatment  Patient Details  Name: Ashley Olson MRN: [id number] Date of Birth: 1985-08-08 Referring Provider: Roosvelt Maser   Encounter Date: 12/12/2016  PT End of Session - 12/12/16 1441    Visit Number  7    Number of Visits  17    Date for PT Re-Evaluation  12/26/16    PT Start Time  1350    PT Stop Time  1430    PT Time Calculation (min)  40 min    Activity Tolerance  Patient tolerated treatment well    Behavior During Therapy  [facility] for tasks assessed/performed       Past Medical History:  Diagnosis Date  . Morbid obesity (HCC)   . Sciatic pain     Past Surgical History:  Procedure Laterality Date  . APPENDECTOMY      There were no vitals filed for this visit.  Subjective Assessment - 12/12/16 1353    Subjective  Patient reports she had slight flare up over the weekend, put a heating pad on her back and felt better the next day. She has been doing well for the most part, though she still gets pain from sitting for prolonged periods.     Pertinent History  Pt reports that she has had chronic low back pain for the last 2 years. She has a prior history of DJD chronic low back pain, with L sciatica, and possible bulging disc. No known trauma. She woke up one morning with severe pain and states that she couldn't get out of bed. She went to the ED and was ultimately referred for physical therapy She had a bout of physical therapy for 1 month which made a significant improvement in her pain. She continued to have intermittent flares every few months. She has noticed an increase in her pain over the last couple months which is more persistent. No recent trauma or known cause. She has kept her exercises from the therapist a couple years ago but doesn't notice improvement with them. Pain is worse on the L  side and then radiates down the lateral and posterior aspects of her left leg. Pain extends to the back of her left knee. She reports intermittent numbness/tingling in her entire LLE down to her feet/toes. Infrequent buckling of LLE if she is walking an extended distance. Pt reports she has had radiographs but never an MRI of her spine. Chart review indicates L4/5, L5/S1 disc space narrowing. ROS negative for red flags. Denies any fevers/chills, loss of control bladder/bowel incontinence, unintentional wt gain/loss, night sweats. Pt does report recent increase in stress/anxiety. She has been on medications to help her sleep at night    How long can you sit comfortably?  1 hour    How long can you stand comfortably?  20 minutes    How long can you walk comfortably?  30 on flat ground    Diagnostic tests  radiographs 06/2016: Minimal L4-5 and mild L5-S1 disc space narrowing without change, no MRI per patient    Patient Stated Goals  "Get rid of my pain." "Walk and do things that I enjoy without pain." Pt states she would like to be able to shoot guns, play pool, and go fishing without pain    Currently in Pain?  No/denies    Pain Onset  More than a month ago  Observed lifting mechanics with both a 15 and 30# box -- initially noted to flex at her lumbar spine, patient reported pain and achiness around superior lumbar spine where she was flexing (on video). Patient provided with feedback on lifting mechanics both verbally and with video feedback. She reported significant reduction and on several repetitions no pain at all with lifting or eccentrically lowering the box. Also demonstrated putting boxes onto and removing from plinth table to demonstrate ADLs and object manipulation   Standing hip abductions with yellow t-band x 12 bilaterally (challenging but appropriate)                       PT Education - 12/12/16 1440    Education provided  Yes    Education Details  Lifting  mechanics, HEP, and plan to likely discharge after next session.     Person(s) Educated  Patient    Methods  Explanation;Verbal cues;Handout;Demonstration    Comprehension  Verbalized understanding;Returned demonstration;Verbal cues required          PT Long Term Goals - 11/14/16 1336      PT LONG TERM GOAL #1   Title  Pt will be independent with HEP in order to improve strength and decrease pain in order to improve pain-free function at home and work with less pain.    Time  6    Period  Weeks    Status  New    Target Date  12/26/16      PT LONG TERM GOAL #2   Title  Pt will decrease mODI scoreby at least 13 points in order demonstrate clinically significant reduction in pain/disability    Baseline  11/14/16: 52%    Time  6    Period  Weeks    Status  New    Target Date  12/26/16      PT LONG TERM GOAL #3   Title  Pt will report worst low back pain as 8/10 on NPRS in order to demonstrate clinically significant reduction in pain    Baseline  11/14/16: worst: 10/10    Time  6    Period  Weeks    Status  New    Target Date  12/26/16            Plan - 12/12/16 1441    Clinical Impression Statement  Patient demonstrated lumbar flexion and hinge point in lifting mechanics noted on video this date, with lifting initially which was reproductive of her symptoms. PT provided video feedback and cuing for increasing hip and knee movement, which she reports completely eased all of her symptoms. She has been doing well to date, and was instructed to return for likely discharge visit in 2-3 weeks.     Clinical Presentation  Stable    Clinical Decision Making  Low    Rehab Potential  Fair    Clinical Impairments Affecting Rehab Potential  Patient's age, motivation, and prior history of benefit from PT are all positive prognostic indications. However the distal radicular nature of her symptoms including sensory deficits and weakness, her job requirements, the chronicity of her symptoms,  and her morbid obesity are all negative prognostic indicators for being successful with conservative measures. Pt will benefit from skilled PT services to address deficits in pain, mobility, and strength in order to improve her function at home and work with less pain.     PT Frequency  2x / week    PT Duration  6  weeks    PT Treatment/Interventions  ADLs/Self Care Home Management;Aquatic Therapy;Biofeedback;Cryotherapy;Electrical Stimulation;Iontophoresis 4mg /ml Dexamethasone;Moist Heat;Traction;Ultrasound;DME Instruction;Gait training;Stair training;Functional mobility training;Therapeutic exercise;Therapeutic activities;Balance training;Neuromuscular re-education;Patient/family education;Manual techniques;Passive range of motion;Dry needling;Energy conservation    PT Next Visit Plan  progress pain control modalities in order to progress ROM and strengthening with less pain. Chronic pain education    PT Home Exercise Plan  Supine isometric clamshells     Consulted and Agree with Plan of Care  Patient       Patient will benefit from skilled therapeutic intervention in order to improve the following deficits and impairments:  Abnormal gait, Difficulty walking, Decreased strength, Pain, Obesity  Visit Diagnosis: Chronic left-sided low back pain with left-sided sciatica  Muscle weakness (generalized)  Difficulty in walking, not elsewhere classified     Problem List Patient Active Problem List   Diagnosis Date Noted  . Lumbar degenerative disc disease 05/23/2016  . 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 Depression, recurrent (HCC) 04/18/2016  . GAD (generalized anxiety disorder) 04/18/2016  . Insomnia 04/18/2016  . Elevated BP without diagnosis of hypertension 04/18/2016  . Microcytic anemia 04/18/2016  . Morbid obesity (HCC) 03/10/2014  . Tobacco abuse 03/10/2014   Alva GarnetPatrick Aalani Aikens PT, DPT, CSCS    12/12/2016, 2:47 PM  [facility] [address] [place], [place], [address] Phone: [phone]   Fax:  [phone]  Name: Orie FishermanKendra D Eslinger MRN: [id number] Date of Birth: 10/11/1985

## 2016-12-14 ENCOUNTER — Ambulatory Visit: Payer: 59 | Admitting: Physical Therapy

## 2016-12-18 ENCOUNTER — Ambulatory Visit: Payer: 59 | Admitting: Physical Therapy

## 2016-12-20 ENCOUNTER — Ambulatory Visit: Payer: 59 | Admitting: Physical Therapy

## 2016-12-25 ENCOUNTER — Encounter: Payer: 59 | Admitting: Physical Therapy

## 2016-12-27 ENCOUNTER — Encounter: Payer: 59 | Admitting: Physical Therapy

## 2017-01-01 ENCOUNTER — Encounter: Payer: 59 | Admitting: Physical Therapy

## 2017-01-03 ENCOUNTER — Ambulatory Visit: Payer: 59 | Admitting: Physical Therapy

## 2017-05-04 ENCOUNTER — Ambulatory Visit (INDEPENDENT_AMBULATORY_CARE_PROVIDER_SITE_OTHER): Payer: 59 | Admitting: Family Medicine

## 2017-05-04 ENCOUNTER — Encounter: Payer: Self-pay | Admitting: Family Medicine

## 2017-05-04 VITALS — BP 121/84 | HR 78 | Temp 98.3°F | Ht 72.0 in | Wt 376.2 lb

## 2017-05-04 DIAGNOSIS — M5136 Other intervertebral disc degeneration, lumbar region: Secondary | ICD-10-CM

## 2017-05-04 DIAGNOSIS — F411 Generalized anxiety disorder: Secondary | ICD-10-CM | POA: Diagnosis not present

## 2017-05-04 DIAGNOSIS — G43009 Migraine without aura, not intractable, without status migrainosus: Secondary | ICD-10-CM | POA: Diagnosis not present

## 2017-05-04 MED ORDER — RIZATRIPTAN BENZOATE 10 MG PO TABS: 10.0000 mg | ORAL_TABLET | ORAL | 0 refills | Status: DC | PRN

## 2017-05-04 MED ORDER — CYCLOBENZAPRINE HCL 10 MG PO TABS
10.0000 mg | ORAL_TABLET | Freq: Three times a day (TID) | ORAL | 2 refills | Status: DC | PRN
Start: 2017-05-04 — End: 2019-05-16

## 2017-05-04 MED ORDER — PREDNISONE 10 MG PO TABS: ORAL_TABLET | ORAL | 0 refills | Status: DC

## 2017-05-04 MED ORDER — FLUOXETINE HCL 20 MG PO TABS: 20.0000 mg | ORAL_TABLET | Freq: Every day | ORAL | 3 refills | Status: DC

## 2017-05-04 MED ORDER — SUMATRIPTAN SUCCINATE 6 MG/0.5ML ~~LOC~~ SOLN
6.0000 mg | Freq: Once | SUBCUTANEOUS | Status: AC
  Administered 2017-05-04: 6 mg via SUBCUTANEOUS

## 2017-05-04 MED ORDER — CLONAZEPAM 0.5 MG PO TABS: 0.5000 mg | ORAL_TABLET | Freq: Every day | ORAL | 0 refills | Status: DC | PRN

## 2017-05-04 MED ORDER — IBUPROFEN 800 MG PO TABS: 800.0000 mg | ORAL_TABLET | Freq: Three times a day (TID) | ORAL | 2 refills | Status: DC | PRN

## 2017-05-04 MED ORDER — HYDROXYZINE HCL 25 MG PO TABS: 25.0000 mg | ORAL_TABLET | Freq: Three times a day (TID) | ORAL | 1 refills | Status: DC | PRN

## 2017-05-04 NOTE — Progress Notes (Signed)
BP 121/84 (BP Location: Right Wrist, Patient Position: Sitting, Cuff Size: Large)   Pulse 78   Temp 98.3 F (36.8 C) (Oral)   Ht 6' (1.829 m)   Wt (!) 376 lb 3.2 oz (170.6 kg)   SpO2 99%   BMI 51.02 kg/m    Subjective:    Patient ID: Ashley Olson, female    DOB: 02/28/1985, 32 y.o.   MRN: [id number]  HPI: Ashley Olson is a 32 y.o. female  Chief Complaint  Patient presents with  . Annual Exam  . Headache    Right sided near temple x's 1 week.  . Neck Problem    Patient states there are two lumps on the back of her neck  . Medication Management    Anxiety medications aren't working.   Right sided migraine x 1 week. Lots of photophobia but no visual blurriness, N/V, or other neuro deficits noted. No hx of migraines. Of note, has a bump that's come up behind right ear this past week as well. Denies fevers,chills, recent illness. Has been trying excedrin and ibuprofen with no relief.   Hates her job, having so much stress from that. Anxiety has been out of control. Lots of panic attacks associated. Not sleeping. Mother has lots of health issues and she is primary caregiver, daughter is autistic and getting bullied. Has been on buspar and hydroxyzine for months and does not feel like it's working for her. Denies SI/HI.   Past Medical History:  Diagnosis Date  . Morbid obesity (HCC)   . Sciatic pain    Social History   Socioeconomic History  . Marital status: Single    Spouse name: Not on file  . Number of children: Not on file  . Years of education: Not on file  . Highest education level: Not on file  Occupational History  . Not on file  Social Needs  . Financial resource strain: Not on file  . Food insecurity:    Worry: Not on file    Inability: Not on file  . Transportation needs:    Medical: Not on file    Non-medical: Not on file  Tobacco Use  . Smoking status: Current Every Day Smoker    Packs/day: 0.20    Types: Cigarettes  . Smokeless tobacco:  Never Used  Substance and Sexual Activity  . Alcohol use: Yes    Comment: Occasionally  . Drug use: Yes    Types: Marijuana  . Sexual activity: Yes    Birth control/protection: Condom  Lifestyle  . Physical activity:    Days per week: Not on file    Minutes per session: Not on file  . Stress: Not on file  Relationships  . Social connections:    Talks on phone: Not on file    Gets together: Not on file    Attends religious service: Not on file    Active member of club or organization: Not on file    Attends meetings of clubs or organizations: Not on file    Relationship status: Not on file  . Intimate partner violence:    Fear of current or ex partner: Not on file    Emotionally abused: Not on file    Physically abused: Not on file    Forced sexual activity: Not on file  Other Topics Concern  . Not on file  Social History Narrative  . Not on file    Relevant past medical, surgical, family and social history  reviewed and updated as indicated. Interim medical history since our last visit reviewed. Allergies and medications reviewed and updated.  Review of Systems  Per HPI unless specifically indicated above     Objective:    BP 121/84 (BP Location: Right Wrist, Patient Position: Sitting, Cuff Size: Large)   Pulse 78   Temp 98.3 F (36.8 C) (Oral)   Ht 6' (1.829 m)   Wt (!) 376 lb 3.2 oz (170.6 kg)   SpO2 99%   BMI 51.02 kg/m   Wt Readings from Last 3 Encounters:  05/04/17 (!) 376 lb 3.2 oz (170.6 kg)  12/04/16 (!) 379 lb 12.8 oz (172.3 kg)  11/02/16 (!) 386 lb (175.1 kg)    Physical Exam  Constitutional: She is oriented to person, place, and time. She appears well-developed and well-nourished. No distress.  HENT:  Head: Atraumatic.  Right Ear: External ear normal.  Left Ear: External ear normal.  Mouth/Throat: No oropharyngeal exudate.  Eyes: Pupils are equal, round, and reactive to light. Conjunctivae are normal. No scleral icterus.  Neck: Normal range of  motion. Neck supple.  Cardiovascular: Normal rate and normal heart sounds.  Pulmonary/Chest: Effort normal and breath sounds normal. No respiratory distress.  Musculoskeletal: Normal range of motion.  Lymphadenopathy:       Head (right side): Posterior auricular adenopathy present.  Neurological: She is alert and oriented to person, place, and time. No cranial nerve deficit.  Skin: Skin is warm and dry.  Psychiatric: She has a normal mood and affect. Her behavior is normal.  Nursing note and vitals reviewed.   Results for orders placed or performed in visit on 12/04/16  Microscopic Examination  Result Value Ref Range   WBC, UA 0-5 0 - 5 /hpf   RBC, UA 0-2 0 - 2 /hpf   Epithelial Cells (non renal) >10 (A) 0 - 10 /hpf   Bacteria, UA None seen None seen/Few  CBC with Differential/Platelet  Result Value Ref Range   WBC 5.9 3.4 - 10.8 x10E3/uL   RBC 4.75 3.77 - 5.28 x10E6/uL   Hemoglobin 12.6 11.1 - 15.9 g/dL   Hematocrit 91.439.7 78.234.0 - 46.6 %   MCV 84 79 - 97 fL   MCH 26.5 (L) 26.6 - 33.0 pg   MCHC 31.7 31.5 - 35.7 g/dL   RDW 95.615.8 (H) 21.312.3 - 08.615.4 %   Platelets 330 150 - 379 x10E3/uL   Neutrophils 55 Not Estab. %   Lymphs 34 Not Estab. %   Monocytes 8 Not Estab. %   Eos 3 Not Estab. %   Basos 0 Not Estab. %   Neutrophils Absolute 3.2 1.4 - 7.0 x10E3/uL   Lymphocytes Absolute 2.0 0.7 - 3.1 x10E3/uL   Monocytes Absolute 0.5 0.1 - 0.9 x10E3/uL   EOS (ABSOLUTE) 0.2 0.0 - 0.4 x10E3/uL   Basophils Absolute 0.0 0.0 - 0.2 x10E3/uL   Immature Granulocytes 0 Not Estab. %   Immature Grans (Abs) 0.0 0.0 - 0.1 x10E3/uL  Comprehensive metabolic panel  Result Value Ref Range   Glucose 82 65 - 99 mg/dL   BUN 10 6 - 20 mg/dL   Creatinine, Ser 5.780.95 0.57 - 1.00 mg/dL   GFR calc non Af Amer 81 >59 mL/min/1.73   GFR calc Af Amer 93 >59 mL/min/1.73   BUN/Creatinine Ratio 11 9 - 23   Sodium 140 134 - 144 mmol/L   Potassium 4.6 3.5 - 5.2 mmol/L   Chloride 104 96 - 106 mmol/L   CO2 23 20 -  29  mmol/L   Calcium 9.3 8.7 - 10.2 mg/dL   Total Protein 6.9 6.0 - 8.5 g/dL   Albumin 4.0 3.5 - 5.5 g/dL   Globulin, Total 2.9 1.5 - 4.5 g/dL   Albumin/Globulin Ratio 1.4 1.2 - 2.2   Bilirubin Total <0.2 0.0 - 1.2 mg/dL   Alkaline Phosphatase 75 39 - 117 IU/L   AST 14 0 - 40 IU/L   ALT 19 0 - 32 IU/L  Lipid Panel w/o Chol/HDL Ratio  Result Value Ref Range   Cholesterol, Total 114 100 - 199 mg/dL   Triglycerides 95 0 - 149 mg/dL   HDL 65 >16 mg/dL   VLDL Cholesterol Cal 19 5 - 40 mg/dL   LDL Calculated 30 0 - 99 mg/dL  TSH  Result Value Ref Range   TSH 1.510 0.450 - 4.500 uIU/mL  UA/M w/rflx Culture, Routine  Result Value Ref Range   Specific Gravity, UA 1.025 1.005 - 1.030   pH, UA 6.0 5.0 - 7.5   Color, UA Yellow Yellow   Appearance Ur Cloudy (A) Clear   Leukocytes, UA Negative Negative   Protein, UA 1+ (A) Negative/Trace   Glucose, UA Negative Negative   Ketones, UA Trace (A) Negative   RBC, UA Negative Negative   Bilirubin, UA Negative Negative   Urobilinogen, Ur 0.2 0.2 - 1.0 mg/dL   Nitrite, UA Negative Negative   Microscopic Examination See below:       Assessment & Plan:   Problem List Items Addressed This Visit      Musculoskeletal and Integument   Lumbar degenerative disc disease    Continue flexeril and meloxicam prn, home exercises. Has done PT successfully in th past      Relevant Medications   cyclobenzaprine (FLEXERIL) 10 MG tablet   ibuprofen (ADVIL,MOTRIN) 800 MG tablet   predniSONE (DELTASONE) 10 MG tablet     Other   GAD (generalized anxiety disorder)    Minimal relief with buspar and hydroxyzine, particularly given her increased life stress lately. Having frequent panic episodes, poor quality of life. Will switch to prozac and prn use of klonopin. Risks and sedation precautions reviewed at length, as well as addictive potential. Also given list of counselors, pt ready to start pursuing this.       Relevant Medications   FLUoxetine (PROZAC) 20  MG tablet   hydrOXYzine (ATARAX/VISTARIL) 25 MG tablet    Other Visit Diagnoses    Migraine without aura and without status migrainosus, not intractable    -  Primary   Suspect related to postauricular lymphadenopathy. Will tx with prednisone, IM imitrex, and prn maxalt starting tomorrow if needed. Rest, fluids, f/u if no bette   Relevant Medications   FLUoxetine (PROZAC) 20 MG tablet   clonazePAM (KLONOPIN) 0.5 MG tablet   cyclobenzaprine (FLEXERIL) 10 MG tablet   ibuprofen (ADVIL,MOTRIN) 800 MG tablet   rizatriptan (MAXALT) 10 MG tablet   SUMAtriptan (IMITREX) injection 6 mg (Completed)       Follow up plan: Return in about 1 month (around 06/01/2017) for Anxiety f/u.

## 2017-05-07 NOTE — Assessment & Plan Note (Signed)
Continue flexeril and meloxicam prn, home exercises. Has done PT successfully in th past

## 2017-05-07 NOTE — Patient Instructions (Signed)
Follow up in 1 month   

## 2017-05-07 NOTE — Assessment & Plan Note (Signed)
Minimal relief with buspar and hydroxyzine, particularly given her increased life stress lately. Having frequent panic episodes, poor quality of life. Will switch to prozac and prn use of klonopin. Risks and sedation precautions reviewed at length, as well as addictive potential. Also given list of counselors, pt ready to start pursuing this.

## 2017-05-15 ENCOUNTER — Ambulatory Visit (INDEPENDENT_AMBULATORY_CARE_PROVIDER_SITE_OTHER): Payer: 59 | Admitting: Family Medicine

## 2017-05-15 ENCOUNTER — Encounter: Payer: Self-pay | Admitting: Family Medicine

## 2017-05-15 VITALS — BP 115/74 | HR 79 | Temp 98.3°F | Wt 373.4 lb

## 2017-05-15 DIAGNOSIS — Z113 Encounter for screening for infections with a predominantly sexual mode of transmission: Secondary | ICD-10-CM

## 2017-05-15 DIAGNOSIS — L309 Dermatitis, unspecified: Secondary | ICD-10-CM

## 2017-05-15 DIAGNOSIS — K130 Diseases of lips: Secondary | ICD-10-CM

## 2017-05-15 MED ORDER — TRIAMCINOLONE ACETONIDE 0.1 % EX CREA: 1.0000 "application " | TOPICAL_CREAM | Freq: Two times a day (BID) | CUTANEOUS | 2 refills | Status: DC

## 2017-05-15 NOTE — Progress Notes (Signed)
BP 115/74 (BP Location: Right Wrist, Patient Position: Sitting, Cuff Size: Large)   Pulse 79   Temp 98.3 F (36.8 C) (Oral)   Wt (!) 373 lb 6.4 oz (169.4 kg)   SpO2 98%   BMI 50.64 kg/m    Subjective:    Patient ID: Ashley Olson, female    DOB: 1985-08-07, 32 y.o.   MRN: [id number]  HPI: Ashley Olson is a 32 y.o. female  Chief Complaint  Patient presents with  . Mouth Lesions    Patient states she has a bump on the inside of her mouth. Right bottom lip. Noticed it yesterday.  . Eczema    Patient has itchy small bumps on both hands and chest. Patient's concerned if it's eczema or something else.   Pt here today for evaluation of a lesion inside right upper lip that she first noticed yesterday. Not painful or bleeding, no redness, fevers, myalgias, malaise. No new products, foods, medications. Using listerine mouthwash with no relief. Has never had any mouth sores before and no known hx of HSV or aphthous ulcers.   Also has some itchy bumps on b/l hands and chest, hx of eczema in these areas. Has used triamcinolone cream in the past with good relief. Denies new products, exposures, insect bites.   Past Medical History:  Diagnosis Date  . Morbid obesity (HCC)   . Sciatic pain    Social History   Socioeconomic History  . Marital status: Single    Spouse name: Not on file  . Number of children: Not on file  . Years of education: Not on file  . Highest education level: Not on file  Occupational History  . Not on file  Social Needs  . Financial resource strain: Not on file  . Food insecurity:    Worry: Not on file    Inability: Not on file  . Transportation needs:    Medical: Not on file    Non-medical: Not on file  Tobacco Use  . Smoking status: Current Every Day Smoker    Packs/day: 0.20    Types: Cigarettes  . Smokeless tobacco: Never Used  Substance and Sexual Activity  . Alcohol use: Yes    Comment: Occasionally  . Drug use: Yes    Types: Marijuana   . Sexual activity: Yes    Birth control/protection: Condom  Lifestyle  . Physical activity:    Days per week: Not on file    Minutes per session: Not on file  . Stress: Not on file  Relationships  . Social connections:    Talks on phone: Not on file    Gets together: Not on file    Attends religious service: Not on file    Active member of club or organization: Not on file    Attends meetings of clubs or organizations: Not on file    Relationship status: Not on file  . Intimate partner violence:    Fear of current or ex partner: Not on file    Emotionally abused: Not on file    Physically abused: Not on file    Forced sexual activity: Not on file  Other Topics Concern  . Not on file  Social History Narrative  . Not on file   Relevant past medical, surgical, family and social history reviewed and updated as indicated. Interim medical history since our last visit reviewed. Allergies and medications reviewed and updated.  Review of Systems  Per HPI unless specifically indicated above  Objective:    BP 115/74 (BP Location: Right Wrist, Patient Position: Sitting, Cuff Size: Large)   Pulse 79   Temp 98.3 F (36.8 C) (Oral)   Wt (!) 373 lb 6.4 oz (169.4 kg)   SpO2 98%   BMI 50.64 kg/m   Wt Readings from Last 3 Encounters:  05/15/17 (!) 373 lb 6.4 oz (169.4 kg)  05/04/17 (!) 376 lb 3.2 oz (170.6 kg)  12/04/16 (!) 379 lb 12.8 oz (172.3 kg)    Physical Exam  Constitutional: She is oriented to person, place, and time. She appears well-developed and well-nourished. No distress.  HENT:  Head: Atraumatic.  Small, white lesion on inside of right upper lip. Non-tender, no erythema or drainage  Eyes: Pupils are equal, round, and reactive to light. Conjunctivae are normal.  Neck: Normal range of motion. Neck supple.  Cardiovascular: Normal rate and normal heart sounds.  Pulmonary/Chest: Effort normal and breath sounds normal. No respiratory distress.  Musculoskeletal:  Normal range of motion.  Neurological: She is alert and oriented to person, place, and time.  Skin: Skin is warm and dry. Rash (eczematous rash across dorsal aspect of b/l hands and chest) noted.  Psychiatric: She has a normal mood and affect. Her behavior is normal.  Nursing note and vitals reviewed.   Results for orders placed or performed in visit on 05/15/17  GC/Chlamydia Probe Amp  Result Value Ref Range   Chlamydia trachomatis, NAA Negative Negative   Neisseria gonorrhoeae by PCR Negative Negative  HIV antibody  Result Value Ref Range   HIV Screen 4th Generation wRfx Non Reactive Non Reactive  HSV(herpes simplex vrs) 1+2 ab-IgG  Result Value Ref Range   HSV 1 Glycoprotein G Ab, IgG <0.91 0.00 - 0.90 index   HSV 2 IgG, Type Spec <0.91 0.00 - 0.90 index  RPR  Result Value Ref Range   RPR Ser Ql Non Reactive Non Reactive      Assessment & Plan:   Problem List Items Addressed This Visit      Musculoskeletal and Integument   Eczema    Will restart triamcinolone cream for prn use, start good moisturizing regimen BID. No scented products, hot shower water, or perfumes       Other Visit Diagnoses    Lip lesion    -  Primary   Appears more like a benign aphthous ulcer, salt water gargles, probiotics, multivitamin. Will r/o HSV. Viscous lidocaine if becoming painful   Routine screening for STI (sexually transmitted infection)       Pt recently started seeing someone new and requesting full STI panel. Will await results   Relevant Orders   HIV antibody (Completed)   HSV(herpes simplex vrs) 1+2 ab-IgG (Completed)   RPR (Completed)   GC/Chlamydia Probe Amp (Completed)       Follow up plan: Return for as scheduled.

## 2017-05-16 ENCOUNTER — Telehealth: Payer: Self-pay | Admitting: Family Medicine

## 2017-05-16 LAB — HSV(HERPES SIMPLEX VRS) I + II AB-IGG

## 2017-05-16 LAB — HIV ANTIBODY (ROUTINE TESTING W REFLEX): HIV SCREEN 4TH GENERATION: NONREACTIVE

## 2017-05-16 LAB — RPR: RPR: NONREACTIVE

## 2017-05-16 NOTE — Telephone Encounter (Signed)
Patient notified

## 2017-05-16 NOTE — Telephone Encounter (Signed)
Copied from CRM [id number]. Topic: Quick Communication - See Telephone Encounter >> May 16, 2017  3:09 PM Eston Mouldavis, Baron Parmelee B wrote: CRM for notification. See Telephone encounter for: 05/16/17.  Pt is requesting lab results

## 2017-05-16 NOTE — Telephone Encounter (Signed)
Everything is neg so far,but still waiting on the GC Chlamydia

## 2017-05-17 ENCOUNTER — Encounter: Payer: Self-pay | Admitting: Emergency Medicine

## 2017-05-17 ENCOUNTER — Other Ambulatory Visit: Payer: Self-pay

## 2017-05-17 ENCOUNTER — Ambulatory Visit: Payer: Self-pay | Admitting: *Deleted

## 2017-05-17 ENCOUNTER — Emergency Department
Admission: EM | Admit: 2017-05-17 | Discharge: 2017-05-17 | Disposition: A | Discharge status: Home / Self Care | Payer: 59 | Attending: Emergency Medicine | Admitting: Emergency Medicine

## 2017-05-17 DIAGNOSIS — Z79899 Other long term (current) drug therapy: Secondary | ICD-10-CM | POA: Insufficient documentation

## 2017-05-17 DIAGNOSIS — L309 Dermatitis, unspecified: Secondary | ICD-10-CM | POA: Insufficient documentation

## 2017-05-17 DIAGNOSIS — W57XXXA Bitten or stung by nonvenomous insect and other nonvenomous arthropods, initial encounter: Secondary | ICD-10-CM | POA: Diagnosis not present

## 2017-05-17 DIAGNOSIS — F17210 Nicotine dependence, cigarettes, uncomplicated: Secondary | ICD-10-CM | POA: Insufficient documentation

## 2017-05-17 DIAGNOSIS — Y9389 Activity, other specified: Secondary | ICD-10-CM | POA: Insufficient documentation

## 2017-05-17 DIAGNOSIS — S40861A Insect bite (nonvenomous) of right upper arm, initial encounter: Secondary | ICD-10-CM | POA: Diagnosis not present

## 2017-05-17 DIAGNOSIS — F411 Generalized anxiety disorder: Secondary | ICD-10-CM | POA: Diagnosis not present

## 2017-05-17 DIAGNOSIS — F329 Major depressive disorder, single episode, unspecified: Secondary | ICD-10-CM | POA: Diagnosis not present

## 2017-05-17 DIAGNOSIS — Y929 Unspecified place or not applicable: Secondary | ICD-10-CM | POA: Diagnosis not present

## 2017-05-17 DIAGNOSIS — Y998 Other external cause status: Secondary | ICD-10-CM | POA: Insufficient documentation

## 2017-05-17 LAB — GC/CHLAMYDIA PROBE AMP
Chlamydia trachomatis, NAA: NEGATIVE
NEISSERIA GONORRHOEAE BY PCR: NEGATIVE

## 2017-05-17 MED ORDER — HYDROXYZINE HCL 25 MG PO TABS: 25.0000 mg | ORAL_TABLET | Freq: Three times a day (TID) | ORAL | 0 refills | Status: DC | PRN

## 2017-05-17 MED ORDER — HYDROCORTISONE 2.5 % EX OINT: TOPICAL_OINTMENT | Freq: Two times a day (BID) | CUTANEOUS | 0 refills | Status: DC

## 2017-05-17 NOTE — Patient Instructions (Signed)
Follow up as scheduled.  

## 2017-05-17 NOTE — ED Triage Notes (Signed)
Pt here for bug bite to back of right arm.  Redness noted with some warmth. Does not track. No fevers.  Ambulatory. VSS

## 2017-05-17 NOTE — Assessment & Plan Note (Signed)
Will restart triamcinolone cream for prn use, start good moisturizing regimen BID. No scented products, hot shower water, or perfumes

## 2017-05-17 NOTE — Telephone Encounter (Signed)
Pt called with 2 bites on the back of her arm from yesterday that are red and swollen to the size of a golf ball. She believes these are from a spider bite. She also has one on her back. Pt also states it is itching severely and that she had put ice on it to help with pain and itching. She has not taken anything for the pain or discomfort.  Pt advised to go to the ED to be assessed per protocol. [facility]  Reason for Disposition . [1] Black widow (or brown widow) spider bite AND [2] local skin changes  Answer Assessment - Initial Assessment Questions 1. LOCATION: "Where are the bites located?"      right upper arm, back part 2. ONSET: "When did you get bitten?"      yesterday 3. CAUSE: Why do you suspect bed bugs?" (e.g., recent travel, recent purchase of used clothing)     spider 4. REDNESS: "Is the area red or pink?" If so, ask "What size is area of redness?" (inches or cm). "When did the redness start?"     Red the size of golf ball and spreading 5. ITCHING: "Does it itch?" If so, ask: "How bad is the itch?"    - MILD: doesn't interfere with normal activities   - MODERATE-SEVERE: interferes with work, school, sleep, or other activities      moderate 6. SWELLING: "How big is the swelling?" (inches, cm, or compare to coins)     Size of golf ball 7. OTHER SYMPTOMS: "Do you have any other symptoms?"  (e.g., difficulty breathing, hives)     Sharp pain in chest on right side  Protocols used: SPIDER BITE - [place]-A-AH, BED BUG BITE-A-AH

## 2017-05-17 NOTE — Telephone Encounter (Signed)
FYI

## 2017-05-17 NOTE — ED Provider Notes (Signed)
[facility] Emergency Department Provider Note  ____________________________________________  Time seen: Approximately 1:57 PM  I have reviewed the triage vital signs and the nursing notes.   HISTORY  Chief Complaint Insect Bite   HPI Ashley Olson is a 32 y.o. female who presents to the emergency department for evaluation treatment of what she suspects are 2 bug bites on the back of her right arm. She noticed them yesterday. They burn and itch. No relief with hydrocortisone and benadryl.  Past Medical History:  Diagnosis Date  . Morbid obesity (HCC)   . Sciatic pain     Patient Active Problem List   Diagnosis Date Noted  . Eczema 05/17/2017  . Lumbar degenerative disc disease 05/23/2016  . 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 Depression, recurrent (HCC) 04/18/2016  . GAD (generalized anxiety disorder) 04/18/2016  . Insomnia 04/18/2016  . Elevated BP without diagnosis of hypertension 04/18/2016  . Microcytic anemia 04/18/2016  . Morbid obesity (HCC) 03/10/2014  . Tobacco abuse 03/10/2014    Past Surgical History:  Procedure Laterality Date  . APPENDECTOMY      Prior to Admission medications   Medication Sig Start Date End Date Taking? Authorizing Provider  clonazePAM (KLONOPIN) 0.5 MG tablet Take 1 tablet (0.5 mg total) by mouth daily as needed for anxiety. 05/04/17   Particia Nearing, PA-C  cyclobenzaprine (FLEXERIL) 10 MG tablet Take 1-2 tablets (10-20 mg total) by mouth 3 (three) times daily as needed for muscle spasms. 05/04/17   Particia Nearing, PA-C  FLUoxetine (PROZAC) 20 MG tablet Take 1 tablet (20 mg total) by mouth daily. 05/04/17   Particia Nearing, PA-C  hydrocortisone 2.5 % ointment Apply topically 2 (two) times daily. 05/17/17   Skarlett Sedlacek, Rulon Eisenmenger B, FNP  hydrOXYzine (ATARAX/VISTARIL) 25 MG tablet Take 1 tablet (25 mg total) by mouth 3 (three) times daily as needed. 05/17/17   Halynn Reitano, Rulon Eisenmenger B,  FNP  ibuprofen (ADVIL,MOTRIN) 800 MG tablet Take 1 tablet (800 mg total) by mouth every 8 (eight) hours as needed. 05/04/17   Particia Nearing, PA-C  meloxicam (MOBIC) 15 MG tablet Take 1 tablet (15 mg total) by mouth daily. With food, for 2-4 weeks as needed for back pain 05/23/16   Smitty Cords, DO  rizatriptan (MAXALT) 10 MG tablet Take 1 tablet (10 mg total) by mouth as needed for migraine. May repeat in 2 hours if needed 05/04/17   Particia Nearing, PA-C  triamcinolone cream (KENALOG) 0.1 % Apply 1 application topically 2 (two) times daily. 05/15/17   Particia Nearing, PA-C    Allergies Amoxicillin  Family History  Problem Relation Age of Onset  . Hypertension Mother   . Diabetes Mother   . Autism Daughter   . Diabetes Maternal Grandmother   . Stroke Maternal Grandmother   . Heart block Maternal Grandfather   . Hypertension Maternal Grandfather   . Mental illness Neg Hx     Social History Social History   Tobacco Use  . Smoking status: Current Every Day Smoker    Packs/day: 0.20    Types: Cigarettes  . Smokeless tobacco: Never Used  Substance Use Topics  . Alcohol use: Yes    Comment: Occasionally  . Drug use: Yes    Types: Marijuana    Review of Systems  Constitutional: Negative for fever. Respiratory: Negative for cough or shortness of breath.  Musculoskeletal: Negative for myalgias Skin: Positive for 2 lesions on the right upper  arm. Neurological: Negative for numbness or paresthesias. ____________________________________________   PHYSICAL EXAM:  VITAL SIGNS: ED Triage Vitals  Enc Vitals Group     BP 05/17/17 1344 (!) 146/87     Pulse Rate 05/17/17 1344 83     Resp 05/17/17 1344 18     Temp 05/17/17 1344 98.2 F (36.8 C)     Temp Source 05/17/17 1344 Oral     SpO2 05/17/17 1344 98 %     Weight 05/17/17 1342 (!) 371 lb (168.3 kg)     Height 05/17/17 1342 6' (1.829 m)     Head Circumference --      Peak Flow --      Pain  Score 05/17/17 1341 7     Pain Loc --      Pain Edu? --      Excl. in GC? --      Constitutional: Well appearing. Eyes: Conjunctivae are clear without discharge or drainage. Nose: No rhinorrhea noted. Mouth/Throat: Airway is patent.  Neck: No stridor. Unrestricted range of motion observed. Lymphatic: No adenopathy  Cardiovascular: Capillary refill is <3 seconds.  Respiratory: Respirations are even and unlabored.. Musculoskeletal: Unrestricted range of motion observed. Neurologic: Awake, alert, and oriented x 4.  Skin: Positive for 2 erythematous, annular, macropapular areas on the right posterior upper arm.  ____________________________________________   LABS (all labs ordered are listed, but only abnormal results are displayed)  Labs Reviewed - No data to display ____________________________________________  EKG  Not indicated. ____________________________________________  RADIOLOGY  Not indicated. ____________________________________________   PROCEDURES  Procedures ____________________________________________   INITIAL IMPRESSION / ASSESSMENT AND PLAN / ED COURSE  Ashley Olson is a 32 y.o. female who presents to the emergency department for evaluation and treatment of lesions on the right upper arm that are consistent with insect bites. She will be treated with hydrocortisone and  Vistaril. She was advised to follow up with the PCP for symptoms not improving over the week.  Medications - No data to display   Pertinent labs & imaging results that were available during my care of the patient were reviewed by me and considered in my medical decision making (see chart for details). ____________________________________________   FINAL CLINICAL IMPRESSION(S) / ED DIAGNOSES  Final diagnoses:  Insect bite of right upper arm, initial encounter    ED Discharge Orders        Ordered    hydrocortisone 2.5 % ointment  2 times daily     05/17/17 1421     hydrOXYzine (ATARAX/VISTARIL) 25 MG tablet  3 times daily PRN     05/17/17 1421       Note:  This document was prepared using Dragon voice recognition software and may include unintentional dictation errors.    Chinita Pesterriplett, Ashanti Ratti B, FNP 05/17/17 1538    Jeanmarie PlantMcShane, James A, MD 05/21/17 1447

## 2017-05-17 NOTE — ED Notes (Signed)
See triage note    Thinks she may have been stung/bitten by something yesterday  Woke up with 2 swollen,red areas to right posterior arm

## 2017-06-01 ENCOUNTER — Ambulatory Visit (INDEPENDENT_AMBULATORY_CARE_PROVIDER_SITE_OTHER): Payer: 59 | Admitting: Family Medicine

## 2017-06-01 ENCOUNTER — Encounter: Payer: Self-pay | Admitting: Family Medicine

## 2017-06-01 VITALS — BP 119/72 | HR 97 | Temp 98.5°F | Wt 371.2 lb

## 2017-06-01 DIAGNOSIS — F411 Generalized anxiety disorder: Secondary | ICD-10-CM | POA: Diagnosis not present

## 2017-06-01 DIAGNOSIS — F339 Major depressive disorder, recurrent, unspecified: Secondary | ICD-10-CM | POA: Diagnosis not present

## 2017-06-01 DIAGNOSIS — F99 Mental disorder, not otherwise specified: Secondary | ICD-10-CM | POA: Diagnosis not present

## 2017-06-01 DIAGNOSIS — F5105 Insomnia due to other mental disorder: Secondary | ICD-10-CM | POA: Diagnosis not present

## 2017-06-01 MED ORDER — FLUOXETINE HCL 40 MG PO CAPS
40.0000 mg | ORAL_CAPSULE | Freq: Every day | ORAL | 0 refills | Status: DC
Start: 2017-06-01 — End: 2017-08-21

## 2017-06-01 MED ORDER — QUETIAPINE FUMARATE 100 MG PO TABS: 50.0000 mg | ORAL_TABLET | Freq: Every day | ORAL | 1 refills | Status: DC

## 2017-06-01 MED ORDER — CLONAZEPAM 0.5 MG PO TABS: 0.5000 mg | ORAL_TABLET | Freq: Every day | ORAL | 0 refills | Status: DC | PRN

## 2017-06-01 NOTE — Assessment & Plan Note (Signed)
Will start seroquel nightly to help with sleep as well as moods/anxiousness. Start with 50 mg and titrate up as tolerated and needed. Risks and side effects reviewed. Discussed saving klonopin for severe panic episodes rather than using for sleep

## 2017-06-01 NOTE — Patient Instructions (Signed)
Follow up in 1 month   

## 2017-06-01 NOTE — Assessment & Plan Note (Addendum)
Some benefit with moods from switching to prozac. Will increase to 40 mg and monitor closely for benefit

## 2017-06-01 NOTE — Progress Notes (Signed)
BP 119/72 (BP Location: Right Wrist, Patient Position: Sitting, Cuff Size: Large)   Pulse 97   Temp 98.5 F (36.9 C) (Oral)   Wt (!) 371 lb 3.2 oz (168.4 kg)   LMP 05/03/2017 (Approximate)   SpO2 100%   BMI 50.34 kg/m    Subjective:    Patient ID: Ashley Olson, female    DOB: 1985-11-08, 32 y.o.   MRN: [id number]  HPI: Ashley Olson is a 32 y.o. female  Chief Complaint  Patient presents with  . Anxiety    Follow-up. Patient states things are a little better but her anxiety is still "all over the place."   Pt here today for anxiety and mood f/u after starting prozac and klonopin. Some improvement, but job has been worsening which seems to be making everything worse. Prozac and prn hydroxyzine seems to take a slight edge off and the klonopin helps at nighttime. Having nightmares that wake her from sleep.  Has not yet reached out to counseling yet, states she doesn't have the time right now. Denies SI/HI, severe mood swings, appetite changes.   GAD 7 : Generalized Anxiety Score 06/01/2017 05/04/2017 05/23/2016 04/18/2016  Nervous, Anxious, on Edge 2 3 2 3   Control/stop worrying 3 3 3 3   Worry too much - different things 3 3 3 3   Trouble relaxing 2 3 3 3   Restless 3 1 2 3   Easily annoyed or irritable 3 3 3 3   Afraid - awful might happen 3 3 2 2   Total GAD 7 Score 19 19 18 20   Anxiety Difficulty - - Somewhat difficult Very difficult   Depression screen [facility] 2/9 05/04/2017 05/23/2016 04/18/2016  Decreased Interest 1 2 3   Down, Depressed, Hopeless 2 3 2   PHQ - 2 Score 3 5 5   Altered sleeping 3 3 2   Tired, decreased energy 1 2 3   Change in appetite 3 (No Data) 2  Feeling bad or failure about yourself  3 2 2   Trouble concentrating 3 2 2   Moving slowly or fidgety/restless 0 2 3  Suicidal thoughts 1 0 0  PHQ-9 Score 17 16 19   Difficult doing work/chores - Somewhat difficult -    Relevant past medical, surgical, family and social history reviewed and updated as indicated.  Interim medical history since our last visit reviewed. Allergies and medications reviewed and updated.  Review of Systems  Per HPI unless specifically indicated above     Objective:    BP 119/72 (BP Location: Right Wrist, Patient Position: Sitting, Cuff Size: Large)   Pulse 97   Temp 98.5 F (36.9 C) (Oral)   Wt (!) 371 lb 3.2 oz (168.4 kg)   LMP 05/03/2017 (Approximate)   SpO2 100%   BMI 50.34 kg/m   Wt Readings from Last 3 Encounters:  06/01/17 (!) 371 lb 3.2 oz (168.4 kg)  05/17/17 (!) 371 lb (168.3 kg)  05/15/17 (!) 373 lb 6.4 oz (169.4 kg)    Physical Exam  Constitutional: She is oriented to person, place, and time. She appears well-developed and well-nourished. No distress.  HENT:  Head: Atraumatic.  Eyes: Pupils are equal, round, and reactive to light. Conjunctivae are normal.  Neck: Normal range of motion. Neck supple.  Cardiovascular: Normal rate, regular rhythm and normal heart sounds.  Pulmonary/Chest: Effort normal and breath sounds normal.  Musculoskeletal: Normal range of motion.  Neurological: She is alert and oriented to person, place, and time.  Skin: Skin is warm and dry.  Psychiatric: She  has a normal mood and affect. Her behavior is normal.  Nursing note and vitals reviewed.   Results for orders placed or performed in visit on 05/15/17  GC/Chlamydia Probe Amp  Result Value Ref Range   Chlamydia trachomatis, NAA Negative Negative   Neisseria gonorrhoeae by PCR Negative Negative  HIV antibody  Result Value Ref Range   HIV Screen 4th Generation wRfx Non Reactive Non Reactive  HSV(herpes simplex vrs) 1+2 ab-IgG  Result Value Ref Range   HSV 1 Glycoprotein G Ab, IgG <0.91 0.00 - 0.90 index   HSV 2 IgG, Type Spec <0.91 0.00 - 0.90 index  RPR  Result Value Ref Range   RPR Ser Ql Non Reactive Non Reactive      Assessment & Plan:   Problem List Items Addressed This Visit      Other   Depression, recurrent (HCC) - Primary    Some benefit with  moods from switching to prozac. Will increase to 40 mg and monitor closely for benefit      Relevant Medications   FLUoxetine (PROZAC) 40 MG capsule   GAD (generalized anxiety disorder)    Noting improvement with prn klonopin, though using more for sleep currently than severe anxiousness throughout the day. Hoping to gain better control using during the day and using seroquel at night for sleep      Relevant Medications   FLUoxetine (PROZAC) 40 MG capsule   Insomnia    Will start seroquel nightly to help with sleep as well as moods/anxiousness. Start with 50 mg and titrate up as tolerated and needed. Risks and side effects reviewed. Discussed saving klonopin for severe panic episodes rather than using for sleep          Follow up plan: Return in about 1 month (around 06/29/2017) for anxiety.

## 2017-06-01 NOTE — Assessment & Plan Note (Signed)
Noting improvement with prn klonopin, though using more for sleep currently than severe anxiousness throughout the day. Hoping to gain better control using during the day and using seroquel at night for sleep

## 2017-06-14 IMAGING — CR DG LUMBAR SPINE 2-3V
1 series · 3 of 3 positions shown · non-contrast
Comparison: 07/13/2013.

CLINICAL DATA: 30-year-old female with low back pain with sciatica.
No injury. Initial encounter.

EXAM:
LUMBAR SPINE - 2-3 VIEW

[Series 1: dg lumbar spine 2-3 views · 0.14mm/px · 3 of 3 slices shown]
[im 1/3]
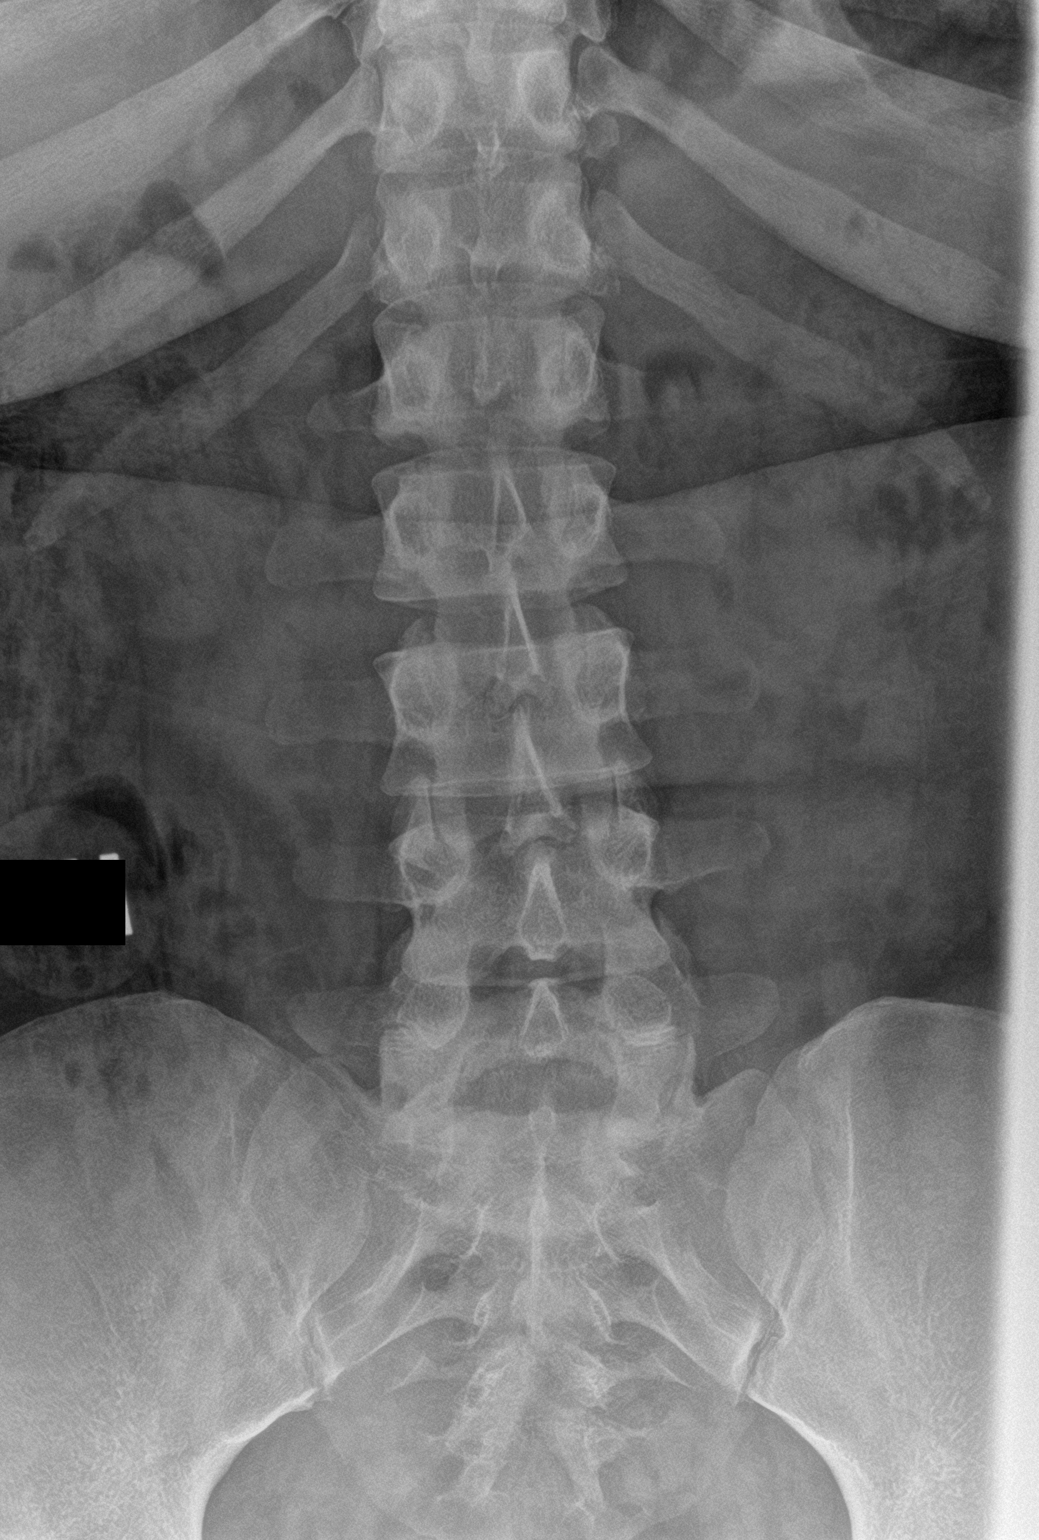
[im 2/3]
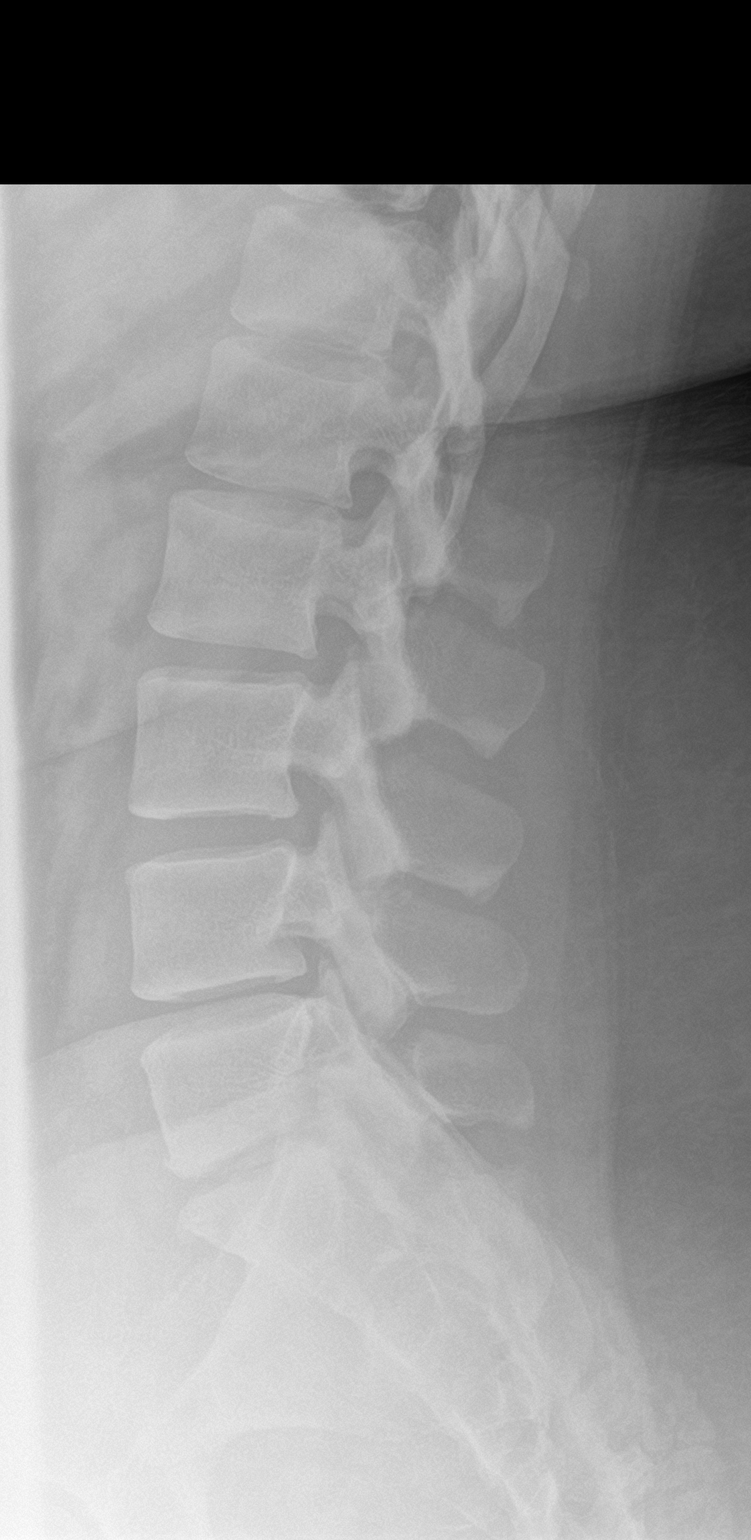
[im 3/3]
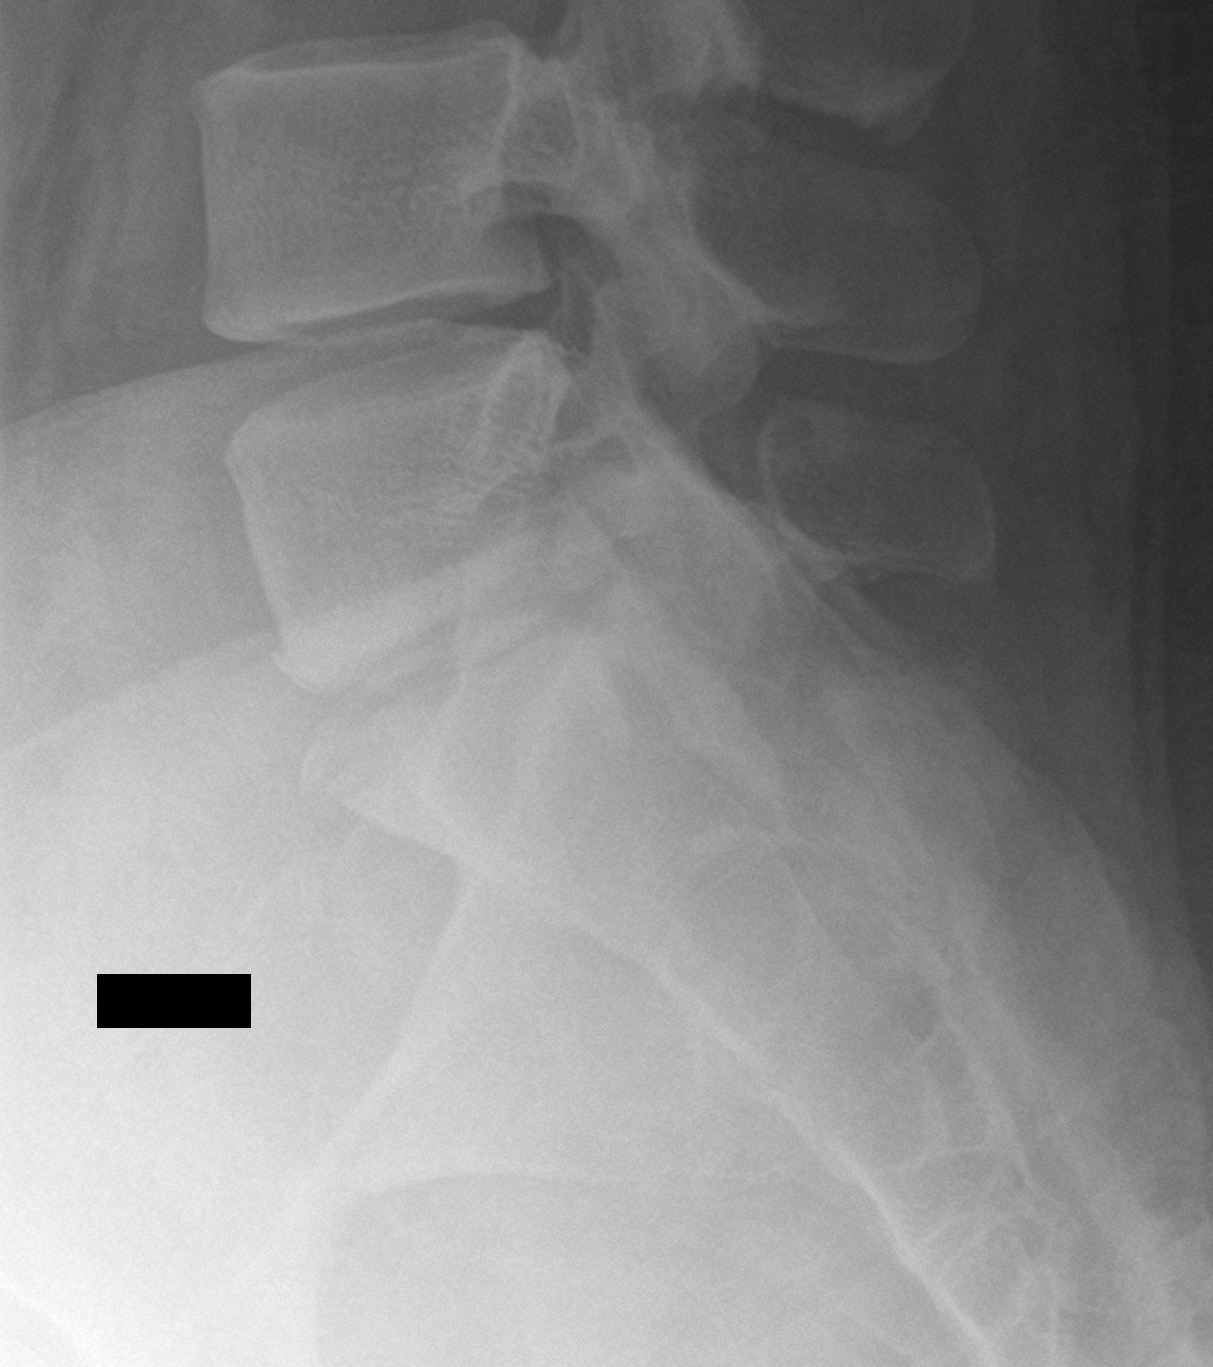

[3 of 3 positions shown; findings below may reference images not displayed]

FINDINGS: Minimal L4-5 and mild L5-S1 disc space narrowing without change.
Normal alignment. No obvious pars defect.
IMPRESSION: Minimal L4-5 and mild L5-S1 disc space narrowing without change.

## 2017-07-03 ENCOUNTER — Other Ambulatory Visit: Payer: Self-pay

## 2017-07-03 ENCOUNTER — Emergency Department
Admission: EM | Admit: 2017-07-03 | Discharge: 2017-07-03 | Disposition: A | Discharge status: Home / Self Care | Payer: No Typology Code available for payment source | Attending: Emergency Medicine | Admitting: Emergency Medicine

## 2017-07-03 ENCOUNTER — Encounter: Payer: Self-pay | Admitting: Emergency Medicine

## 2017-07-03 DIAGNOSIS — F17210 Nicotine dependence, cigarettes, uncomplicated: Secondary | ICD-10-CM | POA: Insufficient documentation

## 2017-07-03 DIAGNOSIS — Z79899 Other long term (current) drug therapy: Secondary | ICD-10-CM | POA: Insufficient documentation

## 2017-07-03 DIAGNOSIS — Z711 Person with feared health complaint in whom no diagnosis is made: Secondary | ICD-10-CM | POA: Insufficient documentation

## 2017-07-03 DIAGNOSIS — R51 Headache: Secondary | ICD-10-CM | POA: Diagnosis not present

## 2017-07-03 DIAGNOSIS — Z7729 Contact with and (suspected ) exposure to other hazardous substances: Secondary | ICD-10-CM | POA: Diagnosis present

## 2017-07-03 DIAGNOSIS — R42 Dizziness and giddiness: Secondary | ICD-10-CM | POA: Insufficient documentation

## 2017-07-03 DIAGNOSIS — R110 Nausea: Secondary | ICD-10-CM | POA: Diagnosis not present

## 2017-07-03 DIAGNOSIS — R519 Headache, unspecified: Secondary | ICD-10-CM

## 2017-07-03 LAB — CBC
HCT: 39 % (ref 35.0–47.0)
HEMOGLOBIN: 12.9 g/dL (ref 12.0–16.0)
MCH: 27 pg (ref 26.0–34.0)
MCHC: 33 g/dL (ref 32.0–36.0)
MCV: 81.7 fL (ref 80.0–100.0)
PLATELETS: 276 10*3/uL (ref 150–440)
RBC: 4.77 MIL/uL (ref 3.80–5.20)
RDW: 16.1 % — ABNORMAL HIGH (ref 11.5–14.5)
WBC: 6.1 10*3/uL (ref 3.6–11.0)

## 2017-07-03 LAB — POCT PREGNANCY, URINE: Preg Test, Ur: NEGATIVE

## 2017-07-03 LAB — COMPREHENSIVE METABOLIC PANEL
ALK PHOS: 77 U/L (ref 38–126)
ALT: 36 U/L (ref 14–54)
ANION GAP: 8 (ref 5–15)
AST: 29 U/L (ref 15–41)
Albumin: 3.8 g/dL (ref 3.5–5.0)
BUN: 22 mg/dL — ABNORMAL HIGH (ref 6–20)
CALCIUM: 9.1 mg/dL (ref 8.9–10.3)
CO2: 24 mmol/L (ref 22–32)
Chloride: 105 mmol/L (ref 101–111)
Creatinine, Ser: 0.85 mg/dL (ref 0.44–1.00)
Glucose, Bld: 83 mg/dL (ref 65–99)
Potassium: 4.3 mmol/L (ref 3.5–5.1)
SODIUM: 137 mmol/L (ref 135–145)
Total Bilirubin: 0.5 mg/dL (ref 0.3–1.2)
Total Protein: 7.9 g/dL (ref 6.5–8.1)

## 2017-07-03 LAB — URINALYSIS, COMPLETE (UACMP) WITH MICROSCOPIC
BILIRUBIN URINE: NEGATIVE
Bacteria, UA: NONE SEEN
Glucose, UA: NEGATIVE mg/dL
HGB URINE DIPSTICK: NEGATIVE
Ketones, ur: NEGATIVE mg/dL
NITRITE: NEGATIVE
PH: 6 (ref 5.0–8.0)
PROTEIN: NEGATIVE mg/dL
Specific Gravity, Urine: 1.02 (ref 1.005–1.030)

## 2017-07-03 LAB — CARBOXYHEMOGLOBIN - COOX: Carboxyhemoglobin: 2.2 % — ABNORMAL HIGH (ref 0.5–1.5)

## 2017-07-03 LAB — LIPASE, BLOOD: LIPASE: 37 U/L (ref 11–51)

## 2017-07-03 NOTE — ED Provider Notes (Signed)
[facility] Emergency Department Provider Note  ____________________________________________  Time seen: Approximately 3:19 PM  I have reviewed the triage vital signs and the nursing notes.   HISTORY  Chief Complaint Toxic Inhalation; Dizziness; and Nausea    HPI Ashley Olson is a 32 y.o. female who presents the emergency department for possible exposure to toxic gas.  Patient reports that she had smelled something at work, reported to his supervisor and they were ordered to evacuate.  Patient reports that smelled like "gas."  Both gas company as well as Warden/ranger responded.  After air testing and utility testing, there was no leak of any gas.  Patient had reported headache, nausea.  Patient denies any loss of consciousness, abdominal pain, emesis.  No medications for this complaint prior to arrival.  Fire department contacted the emergency department informed and that after testing, there is been no exposure to toxic fumes or gas.  Past Medical History:  Diagnosis Date  . Morbid obesity (HCC)   . Sciatic pain     Patient Active Problem List   Diagnosis Date Noted  . Eczema 05/17/2017  . Lumbar degenerative disc disease 05/23/2016  . 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 Depression, recurrent (HCC) 04/18/2016  . GAD (generalized anxiety disorder) 04/18/2016  . Insomnia 04/18/2016  . Elevated BP without diagnosis of hypertension 04/18/2016  . Microcytic anemia 04/18/2016  . Morbid obesity (HCC) 03/10/2014  . Tobacco abuse 03/10/2014    Past Surgical History:  Procedure Laterality Date  . APPENDECTOMY      Prior to Admission medications   Medication Sig Start Date End Date Taking? Authorizing Provider  clonazePAM (KLONOPIN) 0.5 MG tablet Take 1 tablet (0.5 mg total) by mouth daily as needed for anxiety. 06/01/17   Particia Nearing, PA-C  cyclobenzaprine (FLEXERIL) 10 MG tablet Take 1-2 tablets (10-20 mg total)  by mouth 3 (three) times daily as needed for muscle spasms. 05/04/17   Particia Nearing, PA-C  FLUoxetine (PROZAC) 40 MG capsule Take 1 capsule (40 mg total) by mouth daily. 06/01/17   Particia Nearing, PA-C  hydrocortisone 2.5 % ointment Apply topically 2 (two) times daily. 05/17/17   Triplett, Rulon Eisenmenger B, FNP  hydrOXYzine (ATARAX/VISTARIL) 25 MG tablet Take 1 tablet (25 mg total) by mouth 3 (three) times daily as needed. 05/17/17   Triplett, Rulon Eisenmenger B, FNP  ibuprofen (ADVIL,MOTRIN) 800 MG tablet Take 1 tablet (800 mg total) by mouth every 8 (eight) hours as needed. 05/04/17   Particia Nearing, PA-C  QUEtiapine (SEROQUEL) 100 MG tablet Take 0.5 tablets (50 mg total) by mouth at bedtime. 06/01/17   Particia Nearing, PA-C  rizatriptan (MAXALT) 10 MG tablet Take 1 tablet (10 mg total) by mouth as needed for migraine. May repeat in 2 hours if needed 05/04/17   Particia Nearing, PA-C  triamcinolone cream (KENALOG) 0.1 % Apply 1 application topically 2 (two) times daily. 05/15/17   Particia Nearing, PA-C    Allergies Amoxicillin  Family History  Problem Relation Age of Onset  . Hypertension Mother   . Diabetes Mother   . Autism Daughter   . Diabetes Maternal Grandmother   . Stroke Maternal Grandmother   . Heart block Maternal Grandfather   . Hypertension Maternal Grandfather   . Mental illness Neg Hx     Social History Social History   Tobacco Use  . Smoking status: Current Every Day Smoker    Packs/day: 0.20  Types: Cigarettes  . Smokeless tobacco: Never Used  Substance Use Topics  . Alcohol use: Yes    Comment: Occasionally  . Drug use: Yes    Types: Marijuana     Review of Systems  Constitutional: No fever/chills Eyes: No visual changes. No discharge ENT: No upper respiratory complaints. Cardiovascular: no chest pain. Respiratory: no cough. No SOB. Gastrointestinal: No abdominal pain.  No nausea, no vomiting.  No diarrhea.  No  constipation. Genitourinary: Negative for dysuria. No hematuria Musculoskeletal: Negative for musculoskeletal pain. Skin: Negative for rash, abrasions, lacerations, ecchymosis. Neurological: Positive for headache but denies focal weakness or numbness. 10-point ROS otherwise negative.  ____________________________________________   PHYSICAL EXAM:  VITAL SIGNS: ED Triage Vitals  Enc Vitals Group     BP 07/03/17 1318 (!) 148/80     Pulse Rate 07/03/17 1318 60     Resp 07/03/17 1318 16     Temp 07/03/17 1318 98 F (36.7 C)     Temp Source 07/03/17 1318 Oral     SpO2 07/03/17 1318 99 %     Weight 07/03/17 1319 (!) 371 lb (168.3 kg)     Height 07/03/17 1319 6' (1.829 m)     Head Circumference --      Peak Flow --      Pain Score 07/03/17 1319 9     Pain Loc --      Pain Edu? --      Excl. in GC? --      Constitutional: Alert and oriented. Well appearing and in no acute distress. Eyes: Conjunctivae are normal. PERRL. EOMI. Head: Atraumatic. ENT:      Ears:       Nose: No congestion/rhinnorhea.      Mouth/Throat: Mucous membranes are moist.  Neck: No stridor.   Hematological/Lymphatic/Immunilogical: No cervical lymphadenopathy. Cardiovascular: Normal rate, regular rhythm. Normal S1 and S2.  Good peripheral circulation. Respiratory: Normal respiratory effort without tachypnea or retractions. Lungs CTAB. Good air entry to the bases with no decreased or absent breath sounds. Gastrointestinal: Bowel sounds 4 quadrants. Soft and nontender to palpation. No guarding or rigidity. No palpable masses. No distention. No CVA tenderness. Musculoskeletal: Full range of motion to all extremities. No gross deformities appreciated. Neurologic:  Normal speech and language. No gross focal neurologic deficits are appreciated.  Skin:  Skin is warm, dry and intact. No rash noted. Psychiatric: Mood and affect are normal. Speech and behavior are normal. Patient exhibits appropriate insight and  judgement.   ____________________________________________   LABS (all labs ordered are listed, but only abnormal results are displayed)  Labs Reviewed  CARBOXYHEMOGLOBIN - COOX - Abnormal; Notable for the following components:      Result Value   Carboxyhemoglobin 2.2 (*)    All other components within normal limits  COMPREHENSIVE METABOLIC PANEL - Abnormal; Notable for the following components:   BUN 22 (*)    All other components within normal limits  CBC - Abnormal; Notable for the following components:   RDW 16.1 (*)    All other components within normal limits  URINALYSIS, COMPLETE (UACMP) WITH MICROSCOPIC - Abnormal; Notable for the following components:   Color, Urine YELLOW (*)    APPearance HAZY (*)    Leukocytes, UA TRACE (*)    All other components within normal limits  LIPASE, BLOOD  POC URINE PREG, ED  POCT PREGNANCY, URINE   ____________________________________________  EKG   ____________________________________________  RADIOLOGY   No results found.  ____________________________________________    PROCEDURES  Procedure(s) performed:    Procedures    Medications - No data to display   ____________________________________________   INITIAL IMPRESSION / ASSESSMENT AND PLAN / ED COURSE  Pertinent labs & imaging results that were available during my care of the patient were reviewed by me and considered in my medical decision making (see chart for details).  Review of the [place] CSRS was performed in accordance of the [facility] prior to dispensing any controlled drugs.     Patient's diagnosis is consistent with free complaint without diagnosis.  Patient reports that she had been exposed to toxic fumes.  Our department and utility company reports there is no leaks.  Patient was complaining of headache and nausea.  No other symptoms.  Exam was reassuring.  Patient's labs returned with reassuring results.  Carboxyhemoglobin was reported elevated,  however it is within the standard range of up to 3%.  In addition, patient is a smoker with carboxy hemoglobin levels appreciated up to 10% routinely from smoking.  As such, there is no indication that patient has been exposed to carbon monoxide poisoning, which is also confirmed with testing by the fire department.  Patient is not flushed, no other physical exam findings consistent with exposure to carbon monoxide.  No prescriptions at this time.  Patient is to follow up with primary care as needed or otherwise directed. Patient is given ED precautions to return to the ED for any worsening or new symptoms.     ____________________________________________  FINAL CLINICAL IMPRESSION(S) / ED DIAGNOSES  Final diagnoses:  Feared complaint without diagnosis  Nausea  Nonintractable headache, unspecified chronicity pattern, unspecified headache type      NEW MEDICATIONS STARTED DURING THIS VISIT:  ED Discharge Orders    None          This chart was dictated using voice recognition software/Dragon. Despite best efforts to proofread, errors can occur which can change the meaning. Any change was purely unintentional.    Racheal Patches, PA-C 07/03/17 1537    Rockne Menghini, MD 07/04/17 [phone]

## 2017-07-03 NOTE — ED Notes (Signed)
FIRST NURSE NOTE:  Pt working at [employer], had gas leak, here with supervisor, reports nausea and headache, concerned for CO poisoning.

## 2017-07-03 NOTE — ED Notes (Signed)
Fire chief called the charge nurse and states there is no gases detected.  

## 2017-07-03 NOTE — ED Triage Notes (Signed)
Says exposed to something at work.  She and several co workers smelled something.  Both fire dept and [facility] came to site and have not found any leaks.  They did have to leave the building and stand outside.  She says she feels sleepy and dizzy.  In nad.

## 2017-07-03 NOTE — ED Notes (Signed)
Angel murphy HR at [employer] says no uds needed for [facility]

## 2017-07-03 NOTE — ED Notes (Signed)
See triage note  Presents s/p possible gas leak at work  States she became nauseated ,stomach discomfort and headache  States stomach has eased off

## 2017-07-03 NOTE — ED Notes (Signed)
D/w Dr. Roxan Hockey, new order received for carboxyhemoglobin

## 2017-07-06 ENCOUNTER — Ambulatory Visit: Payer: 59 | Admitting: Family Medicine

## 2017-07-10 ENCOUNTER — Encounter: Payer: Self-pay | Admitting: Family Medicine

## 2017-07-10 ENCOUNTER — Encounter (INDEPENDENT_AMBULATORY_CARE_PROVIDER_SITE_OTHER): Payer: 59 | Admitting: Family Medicine

## 2017-07-10 ENCOUNTER — Telehealth: Payer: Self-pay | Admitting: Family Medicine

## 2017-07-10 ENCOUNTER — Ambulatory Visit: Payer: 59 | Admitting: Family Medicine

## 2017-07-10 NOTE — Progress Notes (Signed)
LMP 06/26/2017 (Approximate)    Subjective:    Patient ID: Ashley Olson, female    DOB: 06/10/1985, 32 y.o.   MRN: [id number]  HPI: Ashley Olson is a 32 y.o. female  No chief complaint on file.   Relevant past medical, surgical, family and social history reviewed and updated as indicated. Interim medical history since our last visit reviewed. Allergies and medications reviewed and updated.  Review of Systems  Per HPI unless specifically indicated above     Objective:    LMP 06/26/2017 (Approximate)   Wt Readings from Last 3 Encounters:  07/03/17 (!) 371 lb (168.3 kg)  06/01/17 (!) 371 lb 3.2 oz (168.4 kg)  05/17/17 (!) 371 lb (168.3 kg)    Physical Exam  Results for orders placed or performed during the hospital encounter of 07/03/17  Carboxyhemoglobin (single result)  Result Value Ref Range   Carboxyhemoglobin 2.2 (H) 0.5 - 1.5 %  Lipase, blood  Result Value Ref Range   Lipase 37 11 - 51 U/L  Comprehensive metabolic panel  Result Value Ref Range   Sodium 137 135 - 145 mmol/L   Potassium 4.3 3.5 - 5.1 mmol/L   Chloride 105 101 - 111 mmol/L   CO2 24 22 - 32 mmol/L   Glucose, Bld 83 65 - 99 mg/dL   BUN 22 (H) 6 - 20 mg/dL   Creatinine, Ser 4.090.85 0.44 - 1.00 mg/dL   Calcium 9.1 8.9 - 81.110.3 mg/dL   Total Protein 7.9 6.5 - 8.1 g/dL   Albumin 3.8 3.5 - 5.0 g/dL   AST 29 15 - 41 U/L   ALT 36 14 - 54 U/L   Alkaline Phosphatase 77 38 - 126 U/L   Total Bilirubin 0.5 0.3 - 1.2 mg/dL   GFR calc non Af Amer >60 >60 mL/min   GFR calc Af Amer >60 >60 mL/min   Anion gap 8 5 - 15  CBC  Result Value Ref Range   WBC 6.1 3.6 - 11.0 K/uL   RBC 4.77 3.80 - 5.20 MIL/uL   Hemoglobin 12.9 12.0 - 16.0 g/dL   HCT 91.439.0 78.235.0 - 95.647.0 %   MCV 81.7 80.0 - 100.0 fL   MCH 27.0 26.0 - 34.0 pg   MCHC 33.0 32.0 - 36.0 g/dL   RDW 21.316.1 (H) 08.611.5 - 57.814.5 %   Platelets 276 150 - 440 K/uL  Urinalysis, Complete w Microscopic  Result Value Ref Range   Color, Urine YELLOW (A) YELLOW   APPearance HAZY (A) CLEAR   Specific Gravity, Urine 1.020 1.005 - 1.030   pH 6.0 5.0 - 8.0   Glucose, UA NEGATIVE NEGATIVE mg/dL   Hgb urine dipstick NEGATIVE NEGATIVE   Bilirubin Urine NEGATIVE NEGATIVE   Ketones, ur NEGATIVE NEGATIVE mg/dL   Protein, ur NEGATIVE NEGATIVE mg/dL   Nitrite NEGATIVE NEGATIVE   Leukocytes, UA TRACE (A) NEGATIVE   RBC / HPF 0-5 0 - 5 RBC/hpf   WBC, UA 6-10 0 - 5 WBC/hpf   Bacteria, UA NONE SEEN NONE SEEN   Squamous Epithelial / LPF 6-10 0 - 5   Mucus PRESENT   Pregnancy, urine POC  Result Value Ref Range   Preg Test, Ur NEGATIVE NEGATIVE      Assessment & Plan:   Problem List Items Addressed This Visit    None       Follow up plan: No follow-ups on file.      This encounter was created in error -  please disregard.

## 2017-07-10 NOTE — Telephone Encounter (Signed)
Provider was running behind and patient decided that she did not want to wait and left without being seen. PHQ9 and GAD7 filled out and left in her room. This indicates that she is doing worse currently than she was last visit.   Please call patient and offer her 1st thing in AM appointment with a different provider as it is important that she have follow up.

## 2017-07-11 NOTE — Telephone Encounter (Signed)
Called pt no answer, LVM to call back to schedule appt

## 2017-07-11 NOTE — Progress Notes (Signed)
Patient left without being seen.

## 2017-07-13 ENCOUNTER — Ambulatory Visit (INDEPENDENT_AMBULATORY_CARE_PROVIDER_SITE_OTHER): Payer: 59 | Admitting: Unknown Physician Specialty

## 2017-07-13 ENCOUNTER — Encounter: Payer: Self-pay | Admitting: Unknown Physician Specialty

## 2017-07-13 DIAGNOSIS — F339 Major depressive disorder, recurrent, unspecified: Secondary | ICD-10-CM | POA: Diagnosis not present

## 2017-07-13 MED ORDER — QUETIAPINE FUMARATE 50 MG PO TABS: 150.0000 mg | ORAL_TABLET | Freq: Every day | ORAL | 1 refills | Status: DC

## 2017-07-13 NOTE — Progress Notes (Signed)
BP 105/71   Pulse 65   Temp 98.2 F (36.8 C) (Oral)   Ht 6' (1.829 m)   Wt (!) 380 lb (172.4 kg)   LMP 06/26/2017 (Approximate)   SpO2 98%   BMI 51.54 kg/m    Subjective:    Patient ID: Ashley Olson, female    DOB: [birth year], 32 y.o.   MRN: [id number]  HPI: Ashley Olson is a 32 y.o. female  Chief Complaint  Patient presents with  . Depression   Depression/anxiety Pt states these are getting a little better.  Anxiety seems to be improving.  Sleep is off and on. Pt is taking Fluoxetine in the AM.  Taking 50 mg Seroquel QHS.  Takes Hydroxyzine daily.  Occasional use of Clonazepam because it makes her feel bad.  Mother is having health issues and has to do everything being an only child, hates her job, and has an autistic daughter and single parent.   Depression screen [facility] 2/9 07/13/2017 07/10/2017 05/04/2017 05/23/2016 04/18/2016  Decreased Interest 2 2 1 2 3   Down, Depressed, Hopeless 3 2 2 3 2   PHQ - 2 Score 5 4 3 5 5   Altered sleeping 2 2 3 3 2   Tired, decreased energy 3 2 1 2 3   Change in appetite 3 3 3  (No Data) 2  Feeling bad or failure about yourself  2 2 3 2 2   Trouble concentrating 2 2 3 2 2   Moving slowly or fidgety/restless 3 3 0 2 3  Suicidal thoughts 0 1 1 0 0  PHQ-9 Score 20 19 17 16 19   Difficult doing work/chores Very difficult Very difficult - Somewhat difficult -   Relevant past medical, surgical, family and social history reviewed and updated as indicated. Interim medical history since our last visit reviewed. Allergies and medications reviewed and updated.  Review of Systems  Per HPI unless specifically indicated above     Objective:    BP 105/71   Pulse 65   Temp 98.2 F (36.8 C) (Oral)   Ht 6' (1.829 m)   Wt (!) 380 lb (172.4 kg)   LMP 06/26/2017 (Approximate)   SpO2 98%   BMI 51.54 kg/m   Wt Readings from Last 3 Encounters:  07/13/17 (!) 380 lb (172.4 kg)  07/10/17 (!) 378 lb (171.5 kg)  07/03/17 (!) 371 lb (168.3 kg)      Physical Exam  Constitutional: She is oriented to person, place, and time. She appears well-developed and well-nourished. No distress.  HENT:  Head: Normocephalic and atraumatic.  Eyes: Conjunctivae and lids are normal. Right eye exhibits no discharge. Left eye exhibits no discharge. No scleral icterus.  Neck: Normal range of motion. Neck supple. No JVD present. Carotid bruit is not present.  Cardiovascular: Normal rate, regular rhythm and normal heart sounds.  Pulmonary/Chest: Effort normal and breath sounds normal.  Abdominal: Normal appearance. There is no splenomegaly or hepatomegaly.  Musculoskeletal: Normal range of motion.  Neurological: She is alert and oriented to person, place, and time.  Skin: Skin is warm, dry and intact. No rash noted. No pallor.  Psychiatric: She has a normal mood and affect. Her behavior is normal. Judgment and thought content normal.    Results for orders placed or performed during the hospital encounter of 07/03/17  Carboxyhemoglobin (single result)  Result Value Ref Range   Carboxyhemoglobin 2.2 (H) 0.5 - 1.5 %  Lipase, blood  Result Value Ref Range   Lipase 37 11 - 51 U/L  Comprehensive metabolic panel  Result Value Ref Range   Sodium 137 135 - 145 mmol/L   Potassium 4.3 3.5 - 5.1 mmol/L   Chloride 105 101 - 111 mmol/L   CO2 24 22 - 32 mmol/L   Glucose, Bld 83 65 - 99 mg/dL   BUN 22 (H) 6 - 20 mg/dL   Creatinine, Ser 4.540.85 0.44 - 1.00 mg/dL   Calcium 9.1 8.9 - 09.810.3 mg/dL   Total Protein 7.9 6.5 - 8.1 g/dL   Albumin 3.8 3.5 - 5.0 g/dL   AST 29 15 - 41 U/L   ALT 36 14 - 54 U/L   Alkaline Phosphatase 77 38 - 126 U/L   Total Bilirubin 0.5 0.3 - 1.2 mg/dL   GFR calc non Af Amer >60 >60 mL/min   GFR calc Af Amer >60 >60 mL/min   Anion gap 8 5 - 15  CBC  Result Value Ref Range   WBC 6.1 3.6 - 11.0 K/uL   RBC 4.77 3.80 - 5.20 MIL/uL   Hemoglobin 12.9 12.0 - 16.0 g/dL   HCT 11.939.0 14.735.0 - 82.947.0 %   MCV 81.7 80.0 - 100.0 fL   MCH 27.0 26.0 - 34.0  pg   MCHC 33.0 32.0 - 36.0 g/dL   RDW 56.216.1 (H) 13.011.5 - 86.514.5 %   Platelets 276 150 - 440 K/uL  Urinalysis, Complete w Microscopic  Result Value Ref Range   Color, Urine YELLOW (A) YELLOW   APPearance HAZY (A) CLEAR   Specific Gravity, Urine 1.020 1.005 - 1.030   pH 6.0 5.0 - 8.0   Glucose, UA NEGATIVE NEGATIVE mg/dL   Hgb urine dipstick NEGATIVE NEGATIVE   Bilirubin Urine NEGATIVE NEGATIVE   Ketones, ur NEGATIVE NEGATIVE mg/dL   Protein, ur NEGATIVE NEGATIVE mg/dL   Nitrite NEGATIVE NEGATIVE   Leukocytes, UA TRACE (A) NEGATIVE   RBC / HPF 0-5 0 - 5 RBC/hpf   WBC, UA 6-10 0 - 5 WBC/hpf   Bacteria, UA NONE SEEN NONE SEEN   Squamous Epithelial / LPF 6-10 0 - 5   Mucus PRESENT   Pregnancy, urine POC  Result Value Ref Range   Preg Test, Ur NEGATIVE NEGATIVE      Assessment & Plan:   Problem List Items Addressed This Visit      Unprioritized   Depression, recurrent (HCC)    Pt with severe depression.  She has a number of life issues that are difficult.  Unable to get time for counseling.  She has learned to take 10 minutes of "me" time daily.  Recommend that she increase Seroquel to 150 mg nightly as therapy for persistent depression while on SSRI          Follow up plan: Return in about 1 month (around 08/10/2017) for With Fleet Contrasachel but with me if Fleet ContrasRachel isn't here.

## 2017-07-13 NOTE — Assessment & Plan Note (Addendum)
Pt with severe depression.  She has a number of life issues that are difficult.  Unable to get time for counseling.  She has learned to take 10 minutes of "me" time daily.  Recommend that she increase Seroquel to 150 mg nightly as therapy for persistent depression while on SSRI

## 2017-08-06 ENCOUNTER — Other Ambulatory Visit: Payer: Self-pay | Admitting: Family Medicine

## 2017-08-07 ENCOUNTER — Other Ambulatory Visit: Payer: Self-pay | Admitting: Unknown Physician Specialty

## 2017-08-07 NOTE — Telephone Encounter (Signed)
Request for a 90 day supply of Seroquel.   LOV: 07/13/17 Marg[address],[place]

## 2017-08-21 ENCOUNTER — Encounter: Payer: Self-pay | Admitting: Family Medicine

## 2017-08-21 ENCOUNTER — Ambulatory Visit (INDEPENDENT_AMBULATORY_CARE_PROVIDER_SITE_OTHER): Payer: 59 | Admitting: Family Medicine

## 2017-08-21 VITALS — BP 114/72 | HR 71 | Temp 98.0°F | Ht 72.0 in | Wt 380.6 lb

## 2017-08-21 DIAGNOSIS — F411 Generalized anxiety disorder: Secondary | ICD-10-CM | POA: Diagnosis not present

## 2017-08-21 DIAGNOSIS — Z3009 Encounter for other general counseling and advice on contraception: Secondary | ICD-10-CM | POA: Diagnosis not present

## 2017-08-21 DIAGNOSIS — F339 Major depressive disorder, recurrent, unspecified: Secondary | ICD-10-CM

## 2017-08-21 DIAGNOSIS — Z720 Tobacco use: Secondary | ICD-10-CM

## 2017-08-21 DIAGNOSIS — F5105 Insomnia due to other mental disorder: Secondary | ICD-10-CM

## 2017-08-21 DIAGNOSIS — F99 Mental disorder, not otherwise specified: Secondary | ICD-10-CM

## 2017-08-21 MED ORDER — SUVOREXANT 10 MG PO TABS: 10.0000 mg | ORAL_TABLET | Freq: Every evening | ORAL | 0 refills | Status: DC | PRN

## 2017-08-21 MED ORDER — FLUOXETINE HCL 40 MG PO CAPS: 40.0000 mg | ORAL_CAPSULE | Freq: Every day | ORAL | 1 refills | Status: DC

## 2017-08-21 MED ORDER — HYDROXYZINE HCL 25 MG PO TABS: 25.0000 mg | ORAL_TABLET | Freq: Three times a day (TID) | ORAL | 1 refills | Status: DC | PRN

## 2017-08-21 NOTE — Patient Instructions (Signed)
Reduce seroquel to 2 tablets (100 mg) nightly

## 2017-08-21 NOTE — Progress Notes (Signed)
BP 114/72 (BP Location: Left Arm, Patient Position: Sitting, Cuff Size: Normal)   Pulse 71   Temp 98 F (36.7 C) (Oral)   Ht 6' (1.829 m)   Wt (!) 380 lb 9.6 oz (172.6 kg)   SpO2 99%   BMI 51.62 kg/m    Subjective:    Patient ID: Ashley FishermanKendra D Figuereo, female    DOB: 04/15/1985, 32 y.o.   MRN: [id number]  HPI: Ashley Olson is a 32 y.o. female  Chief Complaint  Patient presents with  . Depression   Here for f/u moods. Still having insomnia every few nights, can't shut her mind off. Seroquel was increased to 150 mg at previous visit and notes while it has been helping her moods, she feels too sedated the next morning.   Anxiety/depression - Klonopin was too sedating so she stopped. Does feel like the prozac is helping with her moods. Denies SI/HI, mood swings.   Quit smoking, 3 months now, cold [place]. Not interested in using any sort of medication assistance to help her stay off even though she is having a hard time with craving control.   Had IUD in the past, but had some complications so can't have one now. Recently became sexually active again and wanting to discuss getting back on contraception. Was told in the past she wasn't a candidate for combination therapy due to her weight and smoking hx.   Past Medical History:  Diagnosis Date  . Morbid obesity (HCC)   . Sciatic pain    Social History   Socioeconomic History  . Marital status: Single    Spouse name: Not on file  . Number of children: Not on file  . Years of education: Not on file  . Highest education level: Not on file  Occupational History  . Not on file  Social Needs  . Financial resource strain: Not on file  . Food insecurity:    Worry: Not on file    Inability: Not on file  . Transportation needs:    Medical: Not on file    Non-medical: Not on file  Tobacco Use  . Smoking status: Former Smoker    Packs/day: 0.20    Types: Cigarettes    Last attempt to quit: 06/12/2017    Years since quitting:  0.1  . Smokeless tobacco: Never Used  Substance and Sexual Activity  . Alcohol use: Yes    Comment: Occasionally  . Drug use: Yes    Types: Marijuana  . Sexual activity: Yes    Birth control/protection: Condom  Lifestyle  . Physical activity:    Days per week: Not on file    Minutes per session: Not on file  . Stress: Not on file  Relationships  . Social connections:    Talks on phone: Not on file    Gets together: Not on file    Attends religious service: Not on file    Active member of club or organization: Not on file    Attends meetings of clubs or organizations: Not on file    Relationship status: Not on file  . Intimate partner violence:    Fear of current or ex partner: Not on file    Emotionally abused: Not on file    Physically abused: Not on file    Forced sexual activity: Not on file  Other Topics Concern  . Not on file  Social History Narrative  . Not on file   Relevant past medical, surgical, family  and social history reviewed and updated as indicated. Interim medical history since our last visit reviewed. Allergies and medications reviewed and updated.  Review of Systems  Per HPI unless specifically indicated above     Objective:    BP 114/72 (BP Location: Left Arm, Patient Position: Sitting, Cuff Size: Normal)   Pulse 71   Temp 98 F (36.7 C) (Oral)   Ht 6' (1.829 m)   Wt (!) 380 lb 9.6 oz (172.6 kg)   SpO2 99%   BMI 51.62 kg/m   Wt Readings from Last 3 Encounters:  08/21/17 (!) 380 lb 9.6 oz (172.6 kg)  07/13/17 (!) 380 lb (172.4 kg)  07/10/17 (!) 378 lb (171.5 kg)    Physical Exam  Constitutional: She is oriented to person, place, and time. She appears well-developed and well-nourished. No distress.  HENT:  Head: Atraumatic.  Eyes: Pupils are equal, round, and reactive to light. EOM are normal.  Neck: Normal range of motion. Neck supple.  Cardiovascular: Normal rate and regular rhythm.  Pulmonary/Chest: Effort normal and breath sounds  normal.  Musculoskeletal: Normal range of motion.  Neurological: She is alert and oriented to person, place, and time.  Skin: Skin is warm and dry.  Psychiatric: She has a normal mood and affect. Her behavior is normal.  Nursing note and vitals reviewed.   Results for orders placed or performed during the hospital encounter of 07/03/17  Carboxyhemoglobin (single result)  Result Value Ref Range   Carboxyhemoglobin 2.2 (H) 0.5 - 1.5 %  Lipase, blood  Result Value Ref Range   Lipase 37 11 - 51 U/L  Comprehensive metabolic panel  Result Value Ref Range   Sodium 137 135 - 145 mmol/L   Potassium 4.3 3.5 - 5.1 mmol/L   Chloride 105 101 - 111 mmol/L   CO2 24 22 - 32 mmol/L   Glucose, Bld 83 65 - 99 mg/dL   BUN 22 (H) 6 - 20 mg/dL   Creatinine, Ser 4.09 0.44 - 1.00 mg/dL   Calcium 9.1 8.9 - 81.1 mg/dL   Total Protein 7.9 6.5 - 8.1 g/dL   Albumin 3.8 3.5 - 5.0 g/dL   AST 29 15 - 41 U/L   ALT 36 14 - 54 U/L   Alkaline Phosphatase 77 38 - 126 U/L   Total Bilirubin 0.5 0.3 - 1.2 mg/dL   GFR calc non Af Amer >60 >60 mL/min   GFR calc Af Amer >60 >60 mL/min   Anion gap 8 5 - 15  CBC  Result Value Ref Range   WBC 6.1 3.6 - 11.0 K/uL   RBC 4.77 3.80 - 5.20 MIL/uL   Hemoglobin 12.9 12.0 - 16.0 g/dL   HCT 91.4 78.2 - 95.6 %   MCV 81.7 80.0 - 100.0 fL   MCH 27.0 26.0 - 34.0 pg   MCHC 33.0 32.0 - 36.0 g/dL   RDW 21.3 (H) 08.6 - 57.8 %   Platelets 276 150 - 440 K/uL  Urinalysis, Complete w Microscopic  Result Value Ref Range   Color, Urine YELLOW (A) YELLOW   APPearance HAZY (A) CLEAR   Specific Gravity, Urine 1.020 1.005 - 1.030   pH 6.0 5.0 - 8.0   Glucose, UA NEGATIVE NEGATIVE mg/dL   Hgb urine dipstick NEGATIVE NEGATIVE   Bilirubin Urine NEGATIVE NEGATIVE   Ketones, ur NEGATIVE NEGATIVE mg/dL   Protein, ur NEGATIVE NEGATIVE mg/dL   Nitrite NEGATIVE NEGATIVE   Leukocytes, UA TRACE (A) NEGATIVE   RBC /  HPF 0-5 0 - 5 RBC/hpf   WBC, UA 6-10 0 - 5 WBC/hpf   Bacteria, UA NONE SEEN  NONE SEEN   Squamous Epithelial / LPF 6-10 0 - 5   Mucus PRESENT   Pregnancy, urine POC  Result Value Ref Range   Preg Test, Ur NEGATIVE NEGATIVE      Assessment & Plan:   Problem List Items Addressed This Visit      Other   Tobacco abuse   Depression, recurrent (HCC) - Primary    Fairly stable on prozac and seroquel. Continue current regimen      Relevant Medications   hydrOXYzine (ATARAX/VISTARIL) 25 MG tablet   FLUoxetine (PROZAC) 40 MG capsule   GAD (generalized anxiety disorder)    Didn't tolerate klonopin. Will continue seroquel and prozac. Pt considering counseling.       Relevant Medications   hydrOXYzine (ATARAX/VISTARIL) 25 MG tablet   FLUoxetine (PROZAC) 40 MG capsule   Insomnia    Decrease seroquel to 100 mg QHS due to morning sedation. Will trial belsomra for sleep. Risks and benefits reviewed.        Other Visit Diagnoses    Encounter for other general counseling or advice on contraception       Discussed options. Interested in the nexplanon, referral placed to GYN for further management   Relevant Orders   Ambulatory referral to Gynecology       Follow up plan: Return in about 3 months (around 11/21/2017) for Sleep, mood, anxiety.

## 2017-08-23 NOTE — Assessment & Plan Note (Signed)
Decrease seroquel to 100 mg QHS due to morning sedation. Will trial belsomra for sleep. Risks and benefits reviewed.

## 2017-08-23 NOTE — Assessment & Plan Note (Signed)
Didn't tolerate klonopin. Will continue seroquel and prozac. Pt considering counseling.

## 2017-08-23 NOTE — Assessment & Plan Note (Signed)
Fairly stable on prozac and seroquel. Continue current regimen

## 2017-08-30 ENCOUNTER — Other Ambulatory Visit: Payer: Self-pay | Admitting: Family Medicine

## 2017-08-31 ENCOUNTER — Ambulatory Visit (INDEPENDENT_AMBULATORY_CARE_PROVIDER_SITE_OTHER): Payer: 59 | Admitting: Certified Nurse Midwife

## 2017-08-31 ENCOUNTER — Encounter: Payer: Self-pay | Admitting: Certified Nurse Midwife

## 2017-08-31 VITALS — BP 130/85 | HR 66 | Ht 72.0 in | Wt 388.1 lb

## 2017-08-31 DIAGNOSIS — Z3046 Encounter for surveillance of implantable subdermal contraceptive: Secondary | ICD-10-CM | POA: Diagnosis not present

## 2017-08-31 DIAGNOSIS — Z30017 Encounter for initial prescription of implantable subdermal contraceptive: Secondary | ICD-10-CM

## 2017-08-31 DIAGNOSIS — N921 Excessive and frequent menstruation with irregular cycle: Secondary | ICD-10-CM | POA: Insufficient documentation

## 2017-08-31 DIAGNOSIS — Z3202 Encounter for pregnancy test, result negative: Secondary | ICD-10-CM

## 2017-08-31 LAB — POCT URINE PREGNANCY: Preg Test, Ur: NEGATIVE

## 2017-08-31 NOTE — Patient Instructions (Signed)
Nexplanon Instructions After Insertion   Keep bandage clean and dry for 24 hours   May use ice/Tylenol/Ibuprofen for soreness or pain   If you develop fever, drainage or increased warmth from incision site-contact office immediately  Etonogestrel implant What is this medicine? ETONOGESTREL (et oh noe JES trel) is a contraceptive (birth control) device. It is used to prevent pregnancy. It can be used for up to 3 years. This medicine may be used for other purposes; ask your health care provider or pharmacist if you have questions. COMMON BRAND NAME(S): Implanon, Nexplanon What should I tell my health care provider before I take this medicine? They need to know if you have any of these conditions: -abnormal vaginal bleeding -blood vessel disease or blood clots -cancer of the breast, cervix, or liver -depression -diabetes -gallbladder disease -headaches -heart disease or recent heart attack -high blood pressure -high cholesterol -kidney disease -liver disease -renal disease -seizures -tobacco smoker -an unusual or allergic reaction to etonogestrel, other hormones, anesthetics or antiseptics, medicines, foods, dyes, or preservatives -pregnant or trying to get pregnant -breast-feeding How should I use this medicine? This device is inserted just under the skin on the inner side of your upper arm by a health care professional. Talk to your pediatrician regarding the use of this medicine in children. Special care may be needed. Overdosage: If you think you have taken too much of this medicine contact a poison control center or emergency room at once. NOTE: This medicine is only for you. Do not share this medicine with others. What if I miss a dose? This does not apply. What may interact with this medicine? Do not take this medicine with any of the following medications: -amprenavir -bosentan -fosamprenavir This medicine may also interact with the following  medications: -barbiturate medicines for inducing sleep or treating seizures -certain medicines for fungal infections like ketoconazole and itraconazole -grapefruit juice -griseofulvin -medicines to treat seizures like carbamazepine, felbamate, oxcarbazepine, phenytoin, topiramate -modafinil -phenylbutazone -rifampin -rufinamide -some medicines to treat HIV infection like atazanavir, indinavir, lopinavir, nelfinavir, tipranavir, ritonavir -St. John's wort This list may not describe all possible interactions. Give your health care provider a list of all the medicines, herbs, non-prescription drugs, or dietary supplements you use. Also tell them if you smoke, drink alcohol, or use illegal drugs. Some items may interact with your medicine. What should I watch for while using this medicine? This product does not protect you against HIV infection (AIDS) or other sexually transmitted diseases. You should be able to feel the implant by pressing your fingertips over the skin where it was inserted. Contact your doctor if you cannot feel the implant, and use a non-hormonal birth control method (such as condoms) until your doctor confirms that the implant is in place. If you feel that the implant may have broken or become bent while in your arm, contact your healthcare provider. What side effects may I notice from receiving this medicine? Side effects that you should report to your doctor or health care professional as soon as possible: -allergic reactions like skin rash, itching or hives, swelling of the face, lips, or tongue -breast lumps -changes in emotions or moods -depressed mood -heavy or prolonged menstrual bleeding -pain, irritation, swelling, or bruising at the insertion site -scar at site of insertion -signs of infection at the insertion site such as fever, and skin redness, pain or discharge -signs of pregnancy -signs and symptoms of a blood clot such as breathing problems; changes in  vision; chest pain; severe,   sudden headache; pain, swelling, warmth in the leg; trouble speaking; sudden numbness or weakness of the face, arm or leg -signs and symptoms of liver injury like dark yellow or brown urine; general ill feeling or flu-like symptoms; light-colored stools; loss of appetite; nausea; right upper belly pain; unusually weak or tired; yellowing of the eyes or skin -unusual vaginal bleeding, discharge -signs and symptoms of a stroke like changes in vision; confusion; trouble speaking or understanding; severe headaches; sudden numbness or weakness of the face, arm or leg; trouble walking; dizziness; loss of balance or coordination Side effects that usually do not require medical attention (report to your doctor or health care professional if they continue or are bothersome): -acne -back pain -breast pain -changes in weight -dizziness -general ill feeling or flu-like symptoms -headache -irregular menstrual bleeding -nausea -sore throat -vaginal irritation or inflammation This list may not describe all possible side effects. Call your doctor for medical advice about side effects. You may report side effects to FDA at 1-800-FDA-1088. Where should I keep my medicine? This drug is given in a hospital or clinic and will not be stored at home. NOTE: This sheet is a summary. It may not cover all possible information. If you have questions about this medicine, talk to your doctor, pharmacist, or health care provider.  2018 Elsevier/Gold Standard (2015-08-12 11:19:22)  

## 2017-08-31 NOTE — Progress Notes (Signed)
[facility] pt [facility] She would like to discuss Nexplanon. Informed consent signed. UPT neg

## 2017-08-31 NOTE — Progress Notes (Signed)
Ashley FishermanKendra D Olson is a 32 y.o. year old African American female here for Nexplanon insertion.  Last sexual intercourse was 08/25/2017 and her pregnancy test today was negative.   Risks/benefits/side effects of Nexplanon have been discussed and her questions have been answered.  Specifically, a failure rate of 02/998 has been reported, with an increased failure rate if pt takes St. John's Wort and/or antiseizure medicaitons.    Ashley Olson is aware of the common side effect of irregular bleeding, which the incidence of decreases over time.  BP 130/85   Pulse 66   Ht 6' (1.829 m)   Wt (!) 388 lb 1 oz (176 kg)   LMP 08/21/2017 (Exact Date)   BMI 52.63 kg/m   No results found for this or any previous visit (from the past 24 hour(s)).   She is right-handed, so her left arm, approximately 4 inches proximal from the elbow, was cleansed with alcohol and anesthetized with 2cc of 2% Lidocaine.  The area was cleansed again with betadine and the Nexplanon was inserted per manufacturer's recommendations without difficulty.  A steri-strip and pressure bandage were applied.  Pt was instructed to keep the area clean and dry, remove pressure bandage in 24 hours, and keep insertion site covered with the steri-strip for 3-5 days.  Back up contraception was recommended for 2 weeks.  She was given a card indicating date Nexplanon was inserted and date it needs to be removed.   Reviewed red flag symptoms and when to call.   RTC as needed.    Ashley Olson, CNM [facility]   NDC: [id number] Lot: [id number] Exp: 10/2019

## 2017-09-03 ENCOUNTER — Other Ambulatory Visit: Payer: Self-pay | Admitting: Family Medicine

## 2017-11-09 ENCOUNTER — Encounter: Payer: Self-pay | Admitting: Family Medicine

## 2017-11-09 ENCOUNTER — Ambulatory Visit (INDEPENDENT_AMBULATORY_CARE_PROVIDER_SITE_OTHER): Payer: 59 | Admitting: Family Medicine

## 2017-11-09 VITALS — BP 107/71 | HR 67 | Temp 98.7°F | Ht 72.0 in | Wt 389.8 lb

## 2017-11-09 DIAGNOSIS — F411 Generalized anxiety disorder: Secondary | ICD-10-CM

## 2017-11-09 DIAGNOSIS — R200 Anesthesia of skin: Secondary | ICD-10-CM

## 2017-11-09 DIAGNOSIS — F99 Mental disorder, not otherwise specified: Secondary | ICD-10-CM

## 2017-11-09 DIAGNOSIS — F339 Major depressive disorder, recurrent, unspecified: Secondary | ICD-10-CM

## 2017-11-09 DIAGNOSIS — F5105 Insomnia due to other mental disorder: Secondary | ICD-10-CM

## 2017-11-09 DIAGNOSIS — G43009 Migraine without aura, not intractable, without status migrainosus: Secondary | ICD-10-CM

## 2017-11-09 MED ORDER — IBUPROFEN 800 MG PO TABS: 800.0000 mg | ORAL_TABLET | Freq: Three times a day (TID) | ORAL | 2 refills | Status: DC | PRN

## 2017-11-09 MED ORDER — HYDROXYZINE HCL 25 MG PO TABS: 25.0000 mg | ORAL_TABLET | Freq: Three times a day (TID) | ORAL | 1 refills | Status: DC | PRN

## 2017-11-09 MED ORDER — FLUOXETINE HCL 40 MG PO CAPS: 40.0000 mg | ORAL_CAPSULE | Freq: Every day | ORAL | 1 refills | Status: DC

## 2017-11-09 MED ORDER — SUVOREXANT 10 MG PO TABS: 10.0000 mg | ORAL_TABLET | Freq: Every evening | ORAL | 3 refills | Status: DC | PRN

## 2017-11-09 NOTE — Progress Notes (Signed)
BP 107/71   Pulse 67   Temp 98.7 F (37.1 C) (Oral)   Ht 6' (1.829 m)   Wt (!) 389 lb 12.8 oz (176.8 kg)   SpO2 98%   BMI 52.87 kg/m    Subjective:    Patient ID: Ashley Olson, female    DOB: 1985/07/21, 32 y.o.   MRN: [id number]  HPI: Ashley Olson is a 32 y.o. female  Chief Complaint  Patient presents with  . Headache    pt states she has been having bad headaches  . Insomnia    pt states she has still been having trouble sleeping  . Pain    pt states she has been having aches and pains in her arms and hands   Here today to discuss several issues.   Arm pain and numbness down to fingers, alternates sides randomely. History of back issues. No known neck injuries, shoulder injuries. Taking OTC NSAIDs with some relief.   Goes to sleep well but waking at about 3 am and can't go back to sleep. Stopped the seroquel about a month ago because the combination of seroquel and belsomra was too sedating.   Maxalt not helping. Having worsened migraines the past few weeks. Nausea, photophobia, phonophobia. Thinks they are stress related. Working on getting a new job currently, current job is a good portion of her stress.   Depression screen [facility] 2/9 08/21/2017 07/13/2017 07/10/2017  Decreased Interest 2 2 2   Down, Depressed, Hopeless 2 3 2   PHQ - 2 Score 4 5 4   Altered sleeping 2 2 2   Tired, decreased energy 3 3 2   Change in appetite 3 3 3   Feeling bad or failure about yourself  2 2 2   Trouble concentrating 3 2 2   Moving slowly or fidgety/restless 3 3 3   Suicidal thoughts 0 0 1  PHQ-9 Score 20 20 19   Difficult doing work/chores - Very difficult Very difficult   GAD 7 : Generalized Anxiety Score 07/10/2017 06/01/2017 05/04/2017 05/23/2016  Nervous, Anxious, on Edge 2 2 3 2   Control/stop worrying 2 3 3 3   Worry too much - different things 1 3 3 3   Trouble relaxing 3 2 3 3   Restless 2 3 1 2   Easily annoyed or irritable 3 3 3 3   Afraid - awful might happen 1 3 3 2   Total GAD 7  Score 14 19 19 18   Anxiety Difficulty Very difficult - - Somewhat difficult     Relevant past medical, surgical, family and social history reviewed and updated as indicated. Interim medical history since our last visit reviewed. Allergies and medications reviewed and updated.  Review of Systems  Per HPI unless specifically indicated above     Objective:    BP 107/71   Pulse 67   Temp 98.7 F (37.1 C) (Oral)   Ht 6' (1.829 m)   Wt (!) 389 lb 12.8 oz (176.8 kg)   SpO2 98%   BMI 52.87 kg/m   Wt Readings from Last 3 Encounters:  11/09/17 (!) 389 lb 12.8 oz (176.8 kg)  08/31/17 (!) 388 lb 1 oz (176 kg)  08/21/17 (!) 380 lb 9.6 oz (172.6 kg)    Physical Exam  Constitutional: She is oriented to person, place, and time. She appears well-nourished. No distress.  obese  HENT:  Head: Atraumatic.  Eyes: Conjunctivae and EOM are normal.  Neck: Normal range of motion. Neck supple.  Cardiovascular: Normal rate and regular rhythm.  Pulmonary/Chest: Effort normal  and breath sounds normal.  Musculoskeletal: Normal range of motion.  Neurological: She is alert and oriented to person, place, and time.  Skin: Skin is warm and dry.  Psychiatric: She has a normal mood and affect. Her behavior is normal.  Nursing note and vitals reviewed.   Results for orders placed or performed in visit on 08/31/17  POCT urine pregnancy  Result Value Ref Range   Preg Test, Ur Negative Negative      Assessment & Plan:   Problem List Items Addressed This Visit      Cardiovascular and Mediastinum   Migraine    Maxalt not helping. Recommended nortriptyline, pt not wanting to add any medicines at this time. Wanting to work on stress relief and see how things go from there. Will take NSAIDs and flexeril, work on stress relief.       Relevant Medications   ibuprofen (ADVIL,MOTRIN) 800 MG tablet   FLUoxetine (PROZAC) 40 MG capsule     Other   Depression, recurrent (HCC)    Fairly stable on prozac,  will add back seroquel at 25 mg for sleep and mood control      Relevant Medications   FLUoxetine (PROZAC) 40 MG capsule   hydrOXYzine (ATARAX/VISTARIL) 25 MG tablet   GAD (generalized anxiety disorder)    Stable on prozac and prn hydroxyzine. Continue current regimen      Relevant Medications   FLUoxetine (PROZAC) 40 MG capsule   hydrOXYzine (ATARAX/VISTARIL) 25 MG tablet   Insomnia - Primary    Will add back seroquel at 25 mg and monitor for sedation. Continue belsomra prn as well as sleep hygiene       Other Visit Diagnoses    Arm numbness       B/l, intermittent. Suspect postural/radiculopathy. Flexeril, NSAIDs, postural improvements, weight loss. WIll try PT if not improving and get imaging       Follow up plan: Return in about 3 months (around 02/09/2018) for Sleep, mood f/u.

## 2017-11-09 NOTE — Patient Instructions (Signed)
Cut the 50 mg seroquel in half and take nightly with belsomra

## 2017-11-18 DIAGNOSIS — G43909 Migraine, unspecified, not intractable, without status migrainosus: Secondary | ICD-10-CM | POA: Insufficient documentation

## 2017-11-18 NOTE — Assessment & Plan Note (Signed)
Stable on prozac and prn hydroxyzine. Continue current regimen

## 2017-11-18 NOTE — Assessment & Plan Note (Signed)
Will add back seroquel at 25 mg and monitor for sedation. Continue belsomra prn as well as sleep hygiene

## 2017-11-18 NOTE — Assessment & Plan Note (Signed)
Fairly stable on prozac, will add back seroquel at 25 mg for sleep and mood control

## 2017-11-18 NOTE — Assessment & Plan Note (Signed)
Maxalt not helping. Recommended nortriptyline, pt not wanting to add any medicines at this time. Wanting to work on stress relief and see how things go from there. Will take NSAIDs and flexeril, work on stress relief.

## 2017-11-21 ENCOUNTER — Ambulatory Visit: Payer: 59 | Admitting: Family Medicine

## 2018-02-01 ENCOUNTER — Other Ambulatory Visit: Payer: Self-pay | Admitting: Family Medicine

## 2018-05-07 ENCOUNTER — Ambulatory Visit (INDEPENDENT_AMBULATORY_CARE_PROVIDER_SITE_OTHER): Payer: 59 | Admitting: Family Medicine

## 2018-05-07 ENCOUNTER — Other Ambulatory Visit: Payer: Self-pay

## 2018-05-07 ENCOUNTER — Encounter: Payer: Self-pay | Admitting: Family Medicine

## 2018-05-07 VITALS — BP 132/84 | HR 98 | Temp 98.1°F | Ht 72.0 in | Wt 395.0 lb

## 2018-05-07 DIAGNOSIS — B9689 Other specified bacterial agents as the cause of diseases classified elsewhere: Secondary | ICD-10-CM

## 2018-05-07 DIAGNOSIS — N760 Acute vaginitis: Secondary | ICD-10-CM

## 2018-05-07 DIAGNOSIS — R300 Dysuria: Secondary | ICD-10-CM

## 2018-05-07 NOTE — Progress Notes (Signed)
BP 132/84   Pulse 98   Temp 98.1 F (36.7 C) (Oral)   Ht 6' (1.829 m)   Wt (!) 395 lb (179.2 kg)   SpO2 99%   BMI 53.57 kg/m    Subjective:    Patient ID: Ashley Olson, female    DOB: Aug 23, 1985, 33 y.o.   MRN: [id number]  HPI: Ashley Olson is a 33 y.o. female  Chief Complaint  Patient presents with  . Urinary Tract Infection    pt states has had burning urination and painful when wipping herself off and discomfor. since last Friday    . This visit was completed via WebEx due to the restrictions of the COVID-19 pandemic. All issues as above were discussed and addressed. Physical exam was done as above through visual confirmation on WebEx. If it was felt that the patient should be evaluated in the office, they were directed there. The patient verbally consented to this visit. . Location of the patient: home . Location of the provider: home . Those involved with this call:  . Provider: Roosvelt Maser, PA-C . CMA: Tiffany Reel, CMA . Front Desk/Registration: Harriet Pho  . Time spent on call: 15 minutes with patient face to face via video conference. More than 50% of this time was spent in counseling and coordination of care.  Pt c/o about 5 days of dysuria and vaginal irritation and discharge. Having some pain when wiping after urinating.Denies itching, burning, hematuria, lesions or rashes, fevers, chills, back pain, N/V/D, concern for STIs, chance of pregnancy. Not trying anything OTC for sxs.   Relevant past medical, surgical, family and social history reviewed and updated as indicated. Interim medical history since our last visit reviewed. Allergies and medications reviewed and updated.  Review of Systems  Per HPI unless specifically indicated above     Objective:    BP 132/84   Pulse 98   Temp 98.1 F (36.7 C) (Oral)   Ht 6' (1.829 m)   Wt (!) 395 lb (179.2 kg)   SpO2 99%   BMI 53.57 kg/m   Wt Readings from Last 3 Encounters:  05/07/18 (!) 395 lb  (179.2 kg)  11/09/17 (!) 389 lb 12.8 oz (176.8 kg)  08/31/17 (!) 388 lb 1 oz (176 kg)    Physical Exam Vitals signs and nursing note reviewed.  Constitutional:      General: She is not in acute distress.    Appearance: Normal appearance.  HENT:     Head: Atraumatic.     Right Ear: External ear normal.     Left Ear: External ear normal.     Nose: Nose normal. No congestion.     Mouth/Throat:     Mouth: Mucous membranes are moist.     Pharynx: Oropharynx is clear. No posterior oropharyngeal erythema.  Eyes:     Extraocular Movements: Extraocular movements intact.     Conjunctiva/sclera: Conjunctivae normal.  Neck:     Musculoskeletal: Normal range of motion.  Cardiovascular:     Comments: Unable to assess via virtual visit Pulmonary:     Effort: Pulmonary effort is normal. No respiratory distress.  Musculoskeletal: Normal range of motion.  Skin:    General: Skin is dry.     Findings: No erythema.  Neurological:     Mental Status: She is alert and oriented to person, place, and time.  Psychiatric:        Mood and Affect: Mood normal.        Thought  Content: Thought content normal.        Judgment: Judgment normal.     Results for orders placed or performed in visit on 08/31/17  POCT urine pregnancy  Result Value Ref Range   Preg Test, Ur Negative Negative      Assessment & Plan:   Problem List Items Addressed This Visit    None    Visit Diagnoses    Dysuria    -  Primary   Suspect sxs from BV infection, small amount of bacteria present on U/A but likely due to BV. Await urine cx and monitor for symptomatic improvement   Relevant Orders   UA/M w/rflx Culture, Routine (Completed)   BV (bacterial vaginosis)       Tx with flagyl, probiotics, good vaginal hygiene. F/u if worsening or not improving   Relevant Medications   metroNIDAZOLE (FLAGYL) 500 MG tablet   Other Relevant Orders   WET PREP FOR TRICH, YEAST, CLUE (Completed)       Follow up plan: Return for  as scheduled.

## 2018-05-08 ENCOUNTER — Other Ambulatory Visit: Payer: Self-pay

## 2018-05-08 DIAGNOSIS — R300 Dysuria: Secondary | ICD-10-CM

## 2018-05-08 DIAGNOSIS — N898 Other specified noninflammatory disorders of vagina: Secondary | ICD-10-CM

## 2018-05-08 LAB — MICROSCOPIC EXAMINATION: RBC: >30 /hpf — AB (ref 0–2)

## 2018-05-08 LAB — UA/M W/RFLX CULTURE, ROUTINE
Bilirubin, UA: NEGATIVE
Glucose, UA: NEGATIVE
Ketones, UA: NEGATIVE
Leukocytes,UA: NEGATIVE
Nitrite, UA: NEGATIVE
Specific Gravity, UA: 1.02 (ref 1.005–1.030)
Urobilinogen, Ur: 0.2 mg/dL (ref 0.2–1.0)
pH, UA: 6 (ref 5.0–7.5)

## 2018-05-08 LAB — WET PREP FOR TRICH, YEAST, CLUE
Clue Cell Exam: POSITIVE — AB
Trichomonas Exam: NEGATIVE
Yeast Exam: NEGATIVE

## 2018-05-09 ENCOUNTER — Telehealth: Payer: Self-pay | Admitting: Family Medicine

## 2018-05-09 NOTE — Telephone Encounter (Signed)
Pt left a vm that she had a visit with [employer] and she was suppose to call in some antibiotics and she wanted to know when would she be sending these. Pharmacy [facility] in [place]

## 2018-05-10 MED ORDER — METRONIDAZOLE 500 MG PO TABS: 500.0000 mg | ORAL_TABLET | Freq: Two times a day (BID) | ORAL | 0 refills | Status: DC

## 2018-05-10 NOTE — Telephone Encounter (Signed)
Sent!

## 2018-05-28 ENCOUNTER — Emergency Department
Admission: EM | Admit: 2018-05-28 | Discharge: 2018-05-28 | Disposition: A | Discharge status: Home / Self Care | Payer: Self-pay | Attending: Emergency Medicine | Admitting: Emergency Medicine

## 2018-05-28 ENCOUNTER — Encounter: Payer: Self-pay | Admitting: Emergency Medicine

## 2018-05-28 ENCOUNTER — Other Ambulatory Visit: Payer: Self-pay

## 2018-05-28 DIAGNOSIS — Z79899 Other long term (current) drug therapy: Secondary | ICD-10-CM | POA: Insufficient documentation

## 2018-05-28 DIAGNOSIS — J020 Streptococcal pharyngitis: Secondary | ICD-10-CM | POA: Insufficient documentation

## 2018-05-28 DIAGNOSIS — F17210 Nicotine dependence, cigarettes, uncomplicated: Secondary | ICD-10-CM | POA: Insufficient documentation

## 2018-05-28 LAB — GROUP A STREP BY PCR: Group A Strep by PCR: DETECTED — AB

## 2018-05-28 MED ORDER — NAPROXEN 500 MG PO TABS: 500.0000 mg | ORAL_TABLET | Freq: Two times a day (BID) | ORAL | 0 refills | Status: DC

## 2018-05-28 MED ORDER — CEFDINIR 300 MG PO CAPS: 300.0000 mg | ORAL_CAPSULE | Freq: Two times a day (BID) | ORAL | 0 refills | Status: AC

## 2018-05-28 MED ORDER — LIDOCAINE VISCOUS HCL 2 % MT SOLN: 15.0000 mL | OROMUCOSAL | 2 refills | Status: DC | PRN

## 2018-05-28 NOTE — ED Triage Notes (Signed)
Patient presents to ED via POV from home with c/o cough and sore throat since Saturday. Patient reports it is painful to swallow. Voice is hoarse.

## 2018-05-28 NOTE — ED Provider Notes (Signed)
[facility] Emergency Department Provider Note  ____________________________________________  Time seen: Approximately 1:01 PM  I have reviewed the triage vital signs and the nursing notes.   HISTORY  Chief Complaint Sore Throat    HPI Ashley Olson is a 33 y.o. female with a history of anemia and morbid obesity who complains of subjective fever and sore throat for the past 2 days.  Fever is controlled with ibuprofen and Tylenol.   Denies shortness of breath chest pain or cough.  No runny nose facial pain sinus pressure or congestion.  No headaches neck pain or stiffness.  Worse with swallowing.  No alleviating factors.  Nonradiating.  No difficulty with breathing or eating.     Past Medical History:  Diagnosis Date  . Anemia   . Morbid obesity (HCC)   . Sciatic pain      Patient Active Problem List   Diagnosis Date Noted  . Migraine 11/18/2017  . Menorrhagia with irregular cycle 08/31/2017  . Eczema 05/17/2017  . Lumbar degenerative disc disease 05/23/2016  . 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 Depression, recurrent (HCC) 04/18/2016  . GAD (generalized anxiety disorder) 04/18/2016  . Insomnia 04/18/2016  . Elevated BP without diagnosis of hypertension 04/18/2016  . Microcytic anemia 04/18/2016  . Morbid obesity (HCC) 03/10/2014  . Tobacco abuse 03/10/2014     Past Surgical History:  Procedure Laterality Date  . APPENDECTOMY       Prior to Admission medications   Medication Sig Start Date End Date Taking? Authorizing Provider  cefdinir (OMNICEF) 300 MG capsule Take 1 capsule (300 mg total) by mouth 2 (two) times daily for 10 days. 05/28/18 06/07/18  Sharman Cheek, MD  cyclobenzaprine (FLEXERIL) 10 MG tablet Take 1-2 tablets (10-20 mg total) by mouth 3 (three) times daily as needed for muscle spasms. 05/04/17   Particia Nearing, PA-C  FLUoxetine (PROZAC) 40 MG capsule Take 1 capsule (40 mg total) by mouth  daily. 11/09/17   Particia Nearing, PA-C  hydrocortisone 2.5 % ointment Apply topically 2 (two) times daily. 05/17/17   Triplett, Rulon Eisenmenger B, FNP  hydrOXYzine (ATARAX/VISTARIL) 25 MG tablet Take 1 tablet (25 mg total) by mouth 3 (three) times daily as needed. 11/09/17   Particia Nearing, PA-C  ibuprofen (ADVIL,MOTRIN) 800 MG tablet Take 1 tablet (800 mg total) by mouth every 8 (eight) hours as needed. 11/09/17   Particia Nearing, PA-C  lidocaine (XYLOCAINE) 2 % solution Use as directed 15 mLs in the mouth or throat as needed for mouth pain. 05/28/18   Sharman Cheek, MD  metroNIDAZOLE (FLAGYL) 500 MG tablet Take 1 tablet (500 mg total) by mouth 2 (two) times daily. 05/10/18   Particia Nearing, PA-C  naproxen (NAPROSYN) 500 MG tablet Take 1 tablet (500 mg total) by mouth 2 (two) times daily with a meal. 05/28/18   Sharman Cheek, MD     Allergies Amoxicillin   Family History  Problem Relation Age of Onset  . Hypertension Mother   . Diabetes Mother   . Autism Daughter   . Diabetes Maternal Grandmother   . Stroke Maternal Grandmother   . Heart block Maternal Grandfather   . Hypertension Maternal Grandfather   . Mental illness Neg Hx     Social History Social History   Tobacco Use  . Smoking status: Current Every Day Smoker    Types: Cigarettes    Last attempt to quit: 06/12/2017    Years since quitting:  0.9  . Smokeless tobacco: Never Used  . Tobacco comment: a pack a week  Substance Use Topics  . Alcohol use: Yes    Comment: Occasionally  . Drug use: Not Currently    Review of Systems  Constitutional:   No fever positive chills.  ENT:   Positive sore throat. No rhinorrhea. Cardiovascular:   No chest pain or syncope. Respiratory:   No dyspnea or cough. Gastrointestinal:   Negative for abdominal pain, vomiting and diarrhea.  Musculoskeletal:   Negative for focal pain or swelling All other systems reviewed and are negative except as documented above in ROS  and HPI.  ____________________________________________   PHYSICAL EXAM:  VITAL SIGNS: ED Triage Vitals  Enc Vitals Group     BP 05/28/18 1045 (!) 145/78     Pulse Rate 05/28/18 1045 (!) 107     Resp 05/28/18 1045 20     Temp 05/28/18 1045 99.6 F (37.6 C)     Temp Source 05/28/18 1045 Oral     SpO2 05/28/18 1045 100 %     Weight 05/28/18 1046 (!) 385 lb (174.6 kg)     Height 05/28/18 1046 6' (1.829 m)     Head Circumference --      Peak Flow --      Pain Score 05/28/18 1046 10     Pain Loc --      Pain Edu? --      Excl. in GC? --     Vital signs reviewed, nursing assessments reviewed.   Constitutional:   Alert and oriented. Non-toxic appearance. Eyes:   Conjunctivae are normal. EOMI. PERRL. ENT      Head:   Normocephalic and atraumatic.      Nose:   No congestion/rhinnorhea.       Mouth/Throat:   MMM, bright uvula and pharyngeal erythema. No peritonsillar mass.  Uvula midline      Neck:   No meningismus. Full ROM. Hematological/Lymphatic/Immunilogical:   Positive left-sided cervical lymphadenopathy Cardiovascular:   RRR. Symmetric bilateral radial and DP pulses.  No murmurs. Cap refill less than 2 seconds. Respiratory:   Normal respiratory effort without tachypnea/retractions. Breath sounds are clear and equal bilaterally. No wheezes/rales/rhonchi. Gastrointestinal:   Soft and nontender. Non distended. There is no CVA tenderness.  No rebound, rigidity, or guarding.  Musculoskeletal:   Normal range of motion in all extremities. No joint effusions.  No lower extremity tenderness.  No edema. Neurologic:   Normal speech and language.  Motor grossly intact. No acute focal neurologic deficits are appreciated.   ____________________________________________    LABS (pertinent positives/negatives) (all labs ordered are listed, but only abnormal results are displayed) Labs Reviewed  GROUP A STREP BY PCR - Abnormal; Notable for the following components:      Result Value    Group A Strep by PCR DETECTED (*)    All other components within normal limits   ____________________________________________   EKG    ____________________________________________    RADIOLOGY  No results found.  ____________________________________________   PROCEDURES Procedures  ____________________________________________    CLINICAL IMPRESSION / ASSESSMENT AND PLAN / ED COURSE  Medications ordered in the ED: Medications - No data to display  Pertinent labs & imaging results that were available during my care of the patient were reviewed by me and considered in my medical decision making (see chart for details).  Ashley Olson was evaluated in Emergency Department on 05/28/2018 for the symptoms described in the history of present illness. She  was evaluated in the context of the global COVID-19 pandemic, which necessitated consideration that the patient might be at risk for infection with the SARS-CoV-2 virus that causes COVID-19. Institutional protocols and algorithms that pertain to the evaluation of patients at risk for COVID-19 are in a state of rapid change based on information released by regulatory bodies including the CDC and federal and state organizations. These policies and algorithms were followed during the patient's care in the ED.   Patient presents with sore throat and subjective fever.  Strep test is positive.  I will start her on Omnicef given her intolerance of amoxicillin in the past.  NSAIDs honey hot tea and viscous lidocaine for symptom relief.  Work note provided.  Doubt COVID infection given the positive strep test.      ____________________________________________   FINAL CLINICAL IMPRESSION(S) / ED DIAGNOSES    Final diagnoses:  Strep pharyngitis     ED Discharge Orders         Ordered    cefdinir (OMNICEF) 300 MG capsule  2 times daily     05/28/18 1300    naproxen (NAPROSYN) 500 MG tablet  2 times daily with meals      05/28/18 1300    lidocaine (XYLOCAINE) 2 % solution  As needed     05/28/18 1300          Portions of this note were generated with dragon dictation software. Dictation errors may occur despite best attempts at proofreading.   Sharman CheekStafford, Zyan Coby, MD 05/28/18 [phone]

## 2018-05-28 NOTE — Discharge Instructions (Signed)
Your strep test was positive.  These take antibiotics as prescribed to treat the strep infection in your throat.  Follow-up with your primary care doctor in 1 week.

## 2018-05-28 NOTE — ED Notes (Signed)
Pt states sore throat and fever Sunday. States took meds for fever Sunday and hasn't had fever since. States never actually took temperature. C/o painful to swallow. States hx of strep and feels the same. Airway patent. No resp distress noted. Talking in complete sentences.

## 2018-09-24 ENCOUNTER — Other Ambulatory Visit: Payer: Self-pay

## 2018-09-24 ENCOUNTER — Encounter: Payer: Self-pay | Admitting: Nurse Practitioner

## 2018-09-24 ENCOUNTER — Ambulatory Visit (INDEPENDENT_AMBULATORY_CARE_PROVIDER_SITE_OTHER): Payer: Self-pay | Admitting: Nurse Practitioner

## 2018-09-24 VITALS — HR 94 | Temp 99.3°F

## 2018-09-24 DIAGNOSIS — Z113 Encounter for screening for infections with a predominantly sexual mode of transmission: Secondary | ICD-10-CM | POA: Insufficient documentation

## 2018-09-24 NOTE — Patient Instructions (Addendum)
Herpes Simplex Test Why am I having this test? The herpes simplex test is used to check for an infection with the herpes simplex virus (HSV). There are two common types of HSV:  Type 1 (HSV1) often causes cold sores on or around the mouth and sometimes on or around the eyes.  Type 2 (HSV2) is a sexually transmitted infection that causes sores in and around the genitals. You may need to have an HSV test if:  Your health care provider thinks that you may have an HSV infection.  You have a weakened body defense system (immune system) and you have sores around your mouth or genitals that look like HSV eruptions.  You have sex with multiple partners, or your partner has genital herpes.  You are pregnant, have herpes, and are expecting to deliver a baby vaginally in the next 6-8 weeks. What is being tested? There are two types of herpes simplex tests:  Blood test. This test checks the sample for: ? HSV antibodies. Antibodies are proteins that your body makes to help fight infection. This test checks whether antibodies against HSV are in your blood. ? HSV antigens. This checks for the presence of the HSV virus (antigen) in your blood.  Culture test. This test checks for the virus in a sample of fluid from an open sore. Culture tests take several days to complete but are very accurate. What kind of sample is taken?     Samples will be collected according to the type of tests your health care provider orders.  For the blood tests, a blood sample is usually collected by inserting a needle into a blood vessel.  For a culture test, the sample is usually collected by swabbing the fluid that is coming from an open sore during an active infection (outbreak). How are the results reported? Your test results will be reported as either positive or negative. For this test, normal results are:  Negative for HSV virus or antibodies in your blood.  Negative for HSV virus in cultured fluid. Sometimes,  the test results may report that a condition is present when it is not present (false-positive result). Sometimes, the test results may report that a condition is not present when it is present (false-negative result). What do the results mean?  A positive result may indicate that you have an active HSV infection. The presence of HSV1 or HSV2 antigens or antibodies in your blood may indicate an active HSV infection.  A negative result means that you do not have HSV1 or HSV2 virus in your blood. This may mean that you do not have HSV infection. Talk with your health care provider about what your results mean. Questions to ask your health care provider Ask your health care provider, or the department that is doing the test:  When will my results be ready?  How will I get my results?  What are my treatment options?  What other tests do I need?  What are my next steps? Summary  You may have this test if your health care provider suspects that you have a herpes simplex virus (HSV) infection.  Type 1 (HSV1) often causes cold sores on or around the mouth and sometimes on or around the eyes. Type 2 (HSV2) is a sexually transmitted infection that causes sores in and around the genitals.  The test may be done using a blood sample or a sample of fluid from an open sore.  A positive result may mean that you have an   active HSV infection. A negative result means that you probably do not have an active infection. Talk with your health care provider about what your results mean. This information is not intended to replace advice given to you by your health care provider. Make sure you discuss any questions you have with your health care provider. Document Released: 02/26/2004 Document Revised: 01/05/2017 Document Reviewed: 12/12/2016 Elsevier Patient Education  [place]. Chlamydia Test Why am I having this test? Chlamydia is a common STI (sexually transmitted infection), especially in  people younger than 33 years of age. Chlamydia may be associated with other STIs, such as gonorrhea. Your health care provider may test you for chlamydia if you:  Are sexually active.  Have another STI.  Have possible symptoms of chlamydia infection, such as pelvic pain or vaginal discharge. What is being tested? This test checks for the presence of the bacteria Chlamydia trachomatis, which causes chlamydia infection. What kind of sample is taken?     Depending on your symptoms, your health care provider may collect one of the following samples:  A urine sample. This is collected in a germ-free (sterile) container that is given to you by your health care provider or the lab where the urine will be examined and tested.  A fluid sample. This may be collected by swabbing one of the following: ? The vagina or the lower part of the womb (cervix). ? The part of your body that drains urine from your bladder (urethra). ? Your eye. ? The upper part of your throat behind the nose (nasopharynx). ? The rectum. How do I prepare for this test?  Do not urinate for an hour before the test.  Try to arrive with a full bladder.  Do not use vaginal creams or douches before the test. Tell a health care provider about:  All medicines you are taking, including vitamins, herbs, eye drops, creams, and over-the-counter medicines.  Any surgeries you have had.  Any medical conditions you have.  Whether you are pregnant or may be pregnant. How are the results reported? Your test results will be reported as either positive or negative for chlamydia. What do the results mean? A positive result means that you have a chlamydia infection. A negative result is normal and means that you do not have a chlamydia infection. Talk with your health care provider about what your results mean. Questions to ask your health care provider Ask your health care provider, or the department that is doing the test:  When  will my results be ready?  How will I get my results?  What are my treatment options?  What other tests do I need?  What are my next steps? Summary  This test may be done to see if you have chlamydia, which is a common STI (sexually transmitted infection).  Depending on your symptoms, your health care provider may collect samples of urine or fluid. The fluid sample is collected by swabbing your vagina, cervix, urethra, eye, nasopharynx, or rectum.  Your test results will be reported as either positive or negative for chlamydia. This information is not intended to replace advice given to you by your health care provider. Make sure you discuss any questions you have with your health care provider. Document Released: 02/16/2004 Document Revised: 05/15/2018 Document Reviewed: 08/23/2016 Elsevier Patient Education  [place].

## 2018-09-24 NOTE — Assessment & Plan Note (Signed)
No specific areas that appear to present like herpes on exam.  Will obtain STD screening tests per patient request to include HSV, RPR, GC/Chlam urine, and HIV.  Notify of results once present.  Return as needed.

## 2018-09-24 NOTE — Progress Notes (Signed)
Pulse 94   Temp 99.3 F (37.4 C) (Oral)   LMP 09/22/2018 (Exact Date)   SpO2 98%    Subjective:    Patient ID: Ashley Olson, female    DOB: 09/16/1985, 33 y.o.   MRN: [id number]  HPI: Ashley Olson is a 33 y.o. female  Chief Complaint  Patient presents with  . Cyst    pt states she has noticed 3 cyst like spots on the opening of her vagina about a week ago   . Labs Only    pt requesting STD screening   BUMPS TO VAGINAL AREA: Noticed three little lumps to vaginal area.  Denies discharge to vaginal area or pain.  Currently on menstrual cycle, just started.  Is using Nexplanon.  States she used [employer] and it does not look like herpes, but wanted to make sure. Sexual activity:  In a Monogamous Relationship Contraception: yes Recent unprotected intercourse: yes History of sexually transmitted diseases: no Previous sexually transmitted disease screening: one year ago Lifetime sexual partners:  Genital lesions: bumps vaginal area with no discharge, pain, or burning.  Stated she did not know they were there until she looked. Penile discharge: N/A Dysuria: no Swollen lymph nodes: no Fevers: no Rash: no  Relevant past medical, surgical, family and social history reviewed and updated as indicated. Interim medical history since our last visit reviewed. Allergies and medications reviewed and updated.  Review of Systems  Constitutional: Negative for activity change, appetite change, diaphoresis, fatigue and fever.  Respiratory: Negative for cough, chest tightness and shortness of breath.   Cardiovascular: Negative for chest pain, palpitations and leg swelling.  Gastrointestinal: Negative for abdominal distention, abdominal pain, constipation, diarrhea, nausea and vomiting.  Genitourinary: Negative for dyspareunia, dysuria, frequency, hematuria, menstrual problem, pelvic pain, urgency, vaginal discharge and vaginal pain.  Neurological: Negative for light-headedness.   Psychiatric/Behavioral: Negative.     Per HPI unless specifically indicated above     Objective:    Pulse 94   Temp 99.3 F (37.4 C) (Oral)   LMP 09/22/2018 (Exact Date)   SpO2 98%   Wt Readings from Last 3 Encounters:  05/28/18 (!) 385 lb (174.6 kg)  05/07/18 (!) 395 lb (179.2 kg)  11/09/17 (!) 389 lb 12.8 oz (176.8 kg)    Physical Exam Vitals signs and nursing note reviewed.  Constitutional:      General: She is awake. She is not in acute distress.    Appearance: She is well-developed. She is not ill-appearing.  HENT:     Head: Normocephalic.     Right Ear: Hearing normal.     Left Ear: Hearing normal.     Nose: Nose normal.     Mouth/Throat:     Mouth: Mucous membranes are moist.     Pharynx: Oropharynx is clear.     Comments: No lesions to lips noted. Eyes:     General: Lids are normal.        Right eye: No discharge.        Left eye: No discharge.     Conjunctiva/sclera: Conjunctivae normal.     Pupils: Pupils are equal, round, and reactive to light.  Neck:     Musculoskeletal: Normal range of motion and neck supple.  Cardiovascular:     Rate and Rhythm: Normal rate and regular rhythm.     Heart sounds: Normal heart sounds. No murmur. No gallop.   Pulmonary:     Effort: Pulmonary effort is normal. No accessory muscle usage  or respiratory distress.     Breath sounds: Normal breath sounds.  Abdominal:     General: Bowel sounds are normal.     Palpations: Abdomen is soft.     Tenderness: There is no abdominal tenderness.  Genitourinary:    Exam position: Lithotomy position.     Comments: External exam noting no vesicles or drainage.  Small raised, hair follicles noted to upper external labia (area of patient concern). Menstrual cycle present. Musculoskeletal:     Right lower leg: No edema.     Left lower leg: No edema.  Skin:    General: Skin is warm and dry.  Neurological:     Mental Status: She is alert and oriented to person, place, and time.   Psychiatric:        Attention and Perception: Attention normal.        Mood and Affect: Mood normal.        Behavior: Behavior normal. Behavior is cooperative.        Thought Content: Thought content normal.        Judgment: Judgment normal.     Results for orders placed or performed during the hospital encounter of 05/28/18  Group A Strep by PCR ([facility] Only)   Specimen: Throat; Sterile Swab  Result Value Ref Range   Group A Strep by PCR DETECTED (A) NOT DETECTED      Assessment & Plan:   Problem List Items Addressed This Visit      Other   Screen for STD (sexually transmitted disease) - Primary    No specific areas that appear to present like herpes on exam.  Will obtain STD screening tests per patient request to include HSV, RPR, GC/Chlam urine, and HIV.  Notify of results once present.  Return as needed.      Relevant Orders   HIV Antibody (routine testing w rflx)   RPR   HSV(herpes simplex vrs) 1+2 ab-IgG   GC/Chlamydia Probe Amp       Follow up plan: Return if symptoms worsen or fail to improve.

## 2018-09-27 LAB — GC/CHLAMYDIA PROBE AMP
Chlamydia trachomatis, NAA: NEGATIVE
Neisseria Gonorrhoeae by PCR: NEGATIVE

## 2018-09-30 ENCOUNTER — Telehealth: Payer: Self-pay | Admitting: Nurse Practitioner

## 2018-09-30 MED ORDER — VALACYCLOVIR HCL 1 G PO TABS: 1000.0000 mg | ORAL_TABLET | Freq: Two times a day (BID) | ORAL | 0 refills | Status: DC

## 2018-09-30 NOTE — Telephone Encounter (Signed)
Spoke to patient via telephone about positive antibody HSV 2 results.  Dicussed testing at length with her and CDC guidelines + benefit/risk of blood work.  She denies any further areas being present to vagina and denies any pain.  At this time we will treat as positive with Valtrex x 7 days and repeat testing in a couple months to assess, discussed with her CDC guidelines on testing.  She stated appreciation for call and all questions answered.

## 2018-10-01 LAB — HIV ANTIBODY (ROUTINE TESTING W REFLEX): HIV Screen 4th Generation wRfx: NONREACTIVE

## 2018-10-01 LAB — HSV(HERPES SIMPLEX VRS) I + II AB-IGG
HSV 1 Glycoprotein G Ab, IgG: <0.91 index (ref 0.00–0.90)
HSV 2 IgG, Type Spec: 3.97 index — ABNORMAL HIGH (ref 0.00–0.90)

## 2018-10-01 LAB — HSV-2 IGG SUPPLEMENTAL TEST: HSV-2 IgG Supplemental Test: POSITIVE — AB

## 2018-10-01 LAB — RPR: RPR Ser Ql: NONREACTIVE

## 2018-10-02 ENCOUNTER — Encounter: Payer: Self-pay | Admitting: Nurse Practitioner

## 2018-10-02 DIAGNOSIS — R7689 Other specified abnormal immunological findings in serum: Secondary | ICD-10-CM | POA: Insufficient documentation

## 2018-10-02 DIAGNOSIS — R768 Other specified abnormal immunological findings in serum: Secondary | ICD-10-CM | POA: Insufficient documentation

## 2019-05-16 ENCOUNTER — Emergency Department
Admission: EM | Admit: 2019-05-16 | Discharge: 2019-05-16 | Disposition: A | Discharge status: Home / Self Care | Payer: Self-pay | Attending: Emergency Medicine | Admitting: Emergency Medicine

## 2019-05-16 ENCOUNTER — Encounter: Payer: Self-pay | Admitting: Emergency Medicine

## 2019-05-16 ENCOUNTER — Other Ambulatory Visit: Payer: Self-pay

## 2019-05-16 ENCOUNTER — Emergency Department: Payer: Self-pay

## 2019-05-16 DIAGNOSIS — F17210 Nicotine dependence, cigarettes, uncomplicated: Secondary | ICD-10-CM | POA: Insufficient documentation

## 2019-05-16 DIAGNOSIS — Z79899 Other long term (current) drug therapy: Secondary | ICD-10-CM | POA: Insufficient documentation

## 2019-05-16 DIAGNOSIS — R609 Edema, unspecified: Secondary | ICD-10-CM | POA: Insufficient documentation

## 2019-05-16 LAB — CBC WITH DIFFERENTIAL/PLATELET
Abs Immature Granulocytes: 0.02 10*3/uL (ref 0.00–0.07)
Basophils Absolute: 0 10*3/uL (ref 0.0–0.1)
Basophils Relative: 1 %
Eosinophils Absolute: 0.2 10*3/uL (ref 0.0–0.5)
Eosinophils Relative: 3 %
HCT: 39.4 % (ref 36.0–46.0)
Hemoglobin: 12.3 g/dL (ref 12.0–15.0)
Immature Granulocytes: 0 %
Lymphocytes Relative: 36 %
Lymphs Abs: 2.2 10*3/uL (ref 0.7–4.0)
MCH: 26.4 pg (ref 26.0–34.0)
MCHC: 31.2 g/dL (ref 30.0–36.0)
MCV: 84.5 fL (ref 80.0–100.0)
Monocytes Absolute: 0.4 10*3/uL (ref 0.1–1.0)
Monocytes Relative: 6 %
Neutro Abs: 3.3 10*3/uL (ref 1.7–7.7)
Neutrophils Relative %: 54 %
Platelets: 253 10*3/uL (ref 150–400)
RBC: 4.66 MIL/uL (ref 3.87–5.11)
RDW: 15.5 % (ref 11.5–15.5)
WBC: 6.1 10*3/uL (ref 4.0–10.5)
nRBC: 0 % (ref 0.0–0.2)

## 2019-05-16 LAB — URINALYSIS, COMPLETE (UACMP) WITH MICROSCOPIC
Bacteria, UA: NONE SEEN
RBC / HPF: >50 RBC/hpf — ABNORMAL HIGH (ref 0–5)
Specific Gravity, Urine: 1.015 (ref 1.005–1.030)
WBC, UA: NONE SEEN WBC/hpf (ref 0–5)

## 2019-05-16 LAB — COMPREHENSIVE METABOLIC PANEL
ALT: 15 U/L (ref 0–44)
AST: 17 U/L (ref 15–41)
Albumin: 3.4 g/dL — ABNORMAL LOW (ref 3.5–5.0)
Alkaline Phosphatase: 64 U/L (ref 38–126)
Anion gap: 5 (ref 5–15)
BUN: 17 mg/dL (ref 6–20)
CO2: 23 mmol/L (ref 22–32)
Calcium: 8.7 mg/dL — ABNORMAL LOW (ref 8.9–10.3)
Chloride: 109 mmol/L (ref 98–111)
Creatinine, Ser: 0.72 mg/dL (ref 0.44–1.00)
GFR calc Af Amer: >60 mL/min (ref >60)
GFR calc non Af Amer: >60 mL/min (ref >60)
Glucose, Bld: 88 mg/dL (ref 70–99)
Potassium: 4.5 mmol/L (ref 3.5–5.1)
Sodium: 137 mmol/L (ref 135–145)
Total Bilirubin: 0.4 mg/dL (ref 0.3–1.2)
Total Protein: 6.9 g/dL (ref 6.5–8.1)

## 2019-05-16 MED ORDER — HYDROCHLOROTHIAZIDE 25 MG PO TABS: 25.0000 mg | ORAL_TABLET | Freq: Every day | ORAL | 0 refills | Status: DC

## 2019-05-16 NOTE — ED Notes (Signed)
See triage note  Presents with bilateral leg swelling    Left leg is worse than the right  Positive pulses in both legs

## 2019-05-16 NOTE — Discharge Instructions (Signed)
Follow-up with your primary care provider if any continued problems or continued edema in your leg without improvement.  Begin taking the hydrochlorothiazide 25 mg 1 daily and watch the amount of sodium that you are either eating or drinking.  Can sodas have a lot of sodium in them.  Elevate your leg often to prevent increased swelling.  Your primary care provider may do a further work-up if you continue to have problems.  If you develop any leg cramps while taking the hydrochlorothiazide discontinue taking it or eat foods that are rich in potassium.

## 2019-05-16 NOTE — ED Provider Notes (Signed)
[facility] Emergency Department Provider Note  ____________________________________________   First MD Initiated Contact with Patient 05/16/19 1107     (approximate)  I have reviewed the triage vital signs and the nursing notes.   HISTORY  Chief Complaint Leg Swelling   HPI Ashley Olson is a 34 y.o. female presents to the ED with complaint of bilateral leg swelling with the left leg being worse than the right.  Patient denies any recent injuries, traveling, sitting for long periods of time or history of previous blood clots.  Patient is currently a cigarette smoker.  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  She denies any shortness of breath or extreme leg pain.  She rates her pain as a 6 out of 10.     Past Medical History:  Diagnosis Date  . Anemia   . Morbid obesity ([place])   . Sciatic pain     Patient Active Problem List   Diagnosis Date Noted  . 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 Screen for STD (sexually transmitted disease) 09/24/2018  . Migraine 11/18/2017  . Menorrhagia with irregular cycle 08/31/2017  . Eczema 05/17/2017  . Lumbar degenerative disc disease 05/23/2016  . 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 Depression, recurrent ([place]) 04/18/2016  . GAD (generalized anxiety disorder) 04/18/2016  . Insomnia 04/18/2016  . Elevated BP without diagnosis of hypertension 04/18/2016  . Microcytic anemia 04/18/2016  . Morbid obesity ([place]) 03/10/2014  . Tobacco abuse 03/10/2014    Past Surgical History:  Procedure Laterality Date  . APPENDECTOMY      Prior to Admission medications   Medication Sig Start Date End Date Taking? Authorizing Provider  FLUoxetine (PROZAC) 40 MG capsule Take 1 capsule (40 mg total) by mouth daily. 11/09/17   Volney American, PA-C  hydrochlorothiazide (HYDRODIURIL) 25 MG tablet Take 1 tablet (25 mg total) by mouth daily. 05/16/19    Johnn Hai, PA-C  hydrocortisone 2.5 % ointment Apply topically 2 (two) times daily. 05/17/17   Triplett, Johnette Abraham B, FNP  hydrOXYzine (ATARAX/VISTARIL) 25 MG tablet Take 1 tablet (25 mg total) by mouth 3 (three) times daily as needed. 11/09/17   Volney American, PA-C  valACYclovir (VALTREX) 1000 MG tablet Take 1 tablet (1,000 mg total) by mouth 2 (two) times daily. 09/30/18   Marnee Guarneri T, NP    Allergies Amoxicillin  Family History  Problem Relation Age of Onset  . Hypertension Mother   . Diabetes Mother   . Autism Daughter   . Diabetes Maternal Grandmother   . Stroke Maternal Grandmother   . Heart block Maternal Grandfather   . Hypertension Maternal Grandfather   . Mental illness Neg Hx     Social History Social History   Tobacco Use  . Smoking status: Current Every Day Smoker    Types: Cigarettes    Last attempt to quit: 06/12/2017    Years since quitting: 1.9  . Smokeless tobacco: Never Used  . Tobacco comment: a pack a week  Substance Use Topics  . Alcohol use: Yes    Comment: Occasionally  . Drug use: Not Currently    Review of Systems Constitutional: No fever/chills Cardiovascular: Denies chest pain. Respiratory: Denies shortness of breath. Gastrointestinal: No abdominal pain.  No nausea, no vomiting.  No diarrhea.  No constipation. Genitourinary: Negative for dysuria.  Presently on menses. Musculoskeletal: Edema to lower extremities with the  left greater than the right. Skin: Negative for rash. Neurological: Negative for headaches, focal weakness or numbness. ____________________________________________   PHYSICAL EXAM:  VITAL SIGNS: ED Triage Vitals  Enc Vitals Group     BP 05/16/19 1036 (!) 149/77     Pulse Rate 05/16/19 1036 88     Resp 05/16/19 1036 18     Temp 05/16/19 1036 98.2 F (36.8 C)     Temp Source 05/16/19 1036 Oral     SpO2 05/16/19 1036 96 %     Weight 05/16/19 1035 (!) 385 lb (174.6 kg)     Height 05/16/19 1035 6'  (1.829 m)     Head Circumference --      Peak Flow --      Pain Score 05/16/19 1046 6     Pain Loc --      Pain Edu? --      Excl. in GC? --     Constitutional: Alert and oriented. Well appearing and in no acute distress. Eyes: Conjunctivae are normal.  Head: Atraumatic. Neck: No stridor.   Cardiovascular: Normal rate, regular rhythm. Grossly normal heart sounds.  Good peripheral circulation. Respiratory: Normal respiratory effort.  No retractions. Lungs CTAB. Gastrointestinal: Soft and nontender. No distention. Musculoskeletal: On examination of the lower extremities there is no gross deformity however there is some mild pitting edema noted to the left lower extremity as noted by the bridges left from her socks.  No skin discoloration or warmth is noted.  Denna Haggard' sign is negative.  Pulses present.  Skin is intact without erythema or abrasions.  No warmth is appreciated. Neurologic:  Normal speech and language. No gross focal neurologic deficits are appreciated.  Skin:  Skin is warm, dry and intact. No rash noted. Psychiatric: Mood and affect are normal. Speech and behavior are normal.  ____________________________________________   LABS (all labs ordered are listed, but only abnormal results are displayed)  Labs Reviewed  URINALYSIS, COMPLETE (UACMP) WITH MICROSCOPIC - Abnormal; Notable for the following components:      Result Value   Color, Urine RED (*)    APPearance CLOUDY (*)    Glucose, UA   (*)    Value: TEST NOT REPORTED DUE TO COLOR INTERFERENCE OF URINE PIGMENT   Hgb urine dipstick   (*)    Value: TEST NOT REPORTED DUE TO COLOR INTERFERENCE OF URINE PIGMENT   Bilirubin Urine   (*)    Value: TEST NOT REPORTED DUE TO COLOR INTERFERENCE OF URINE PIGMENT   Ketones, ur   (*)    Value: TEST NOT REPORTED DUE TO COLOR INTERFERENCE OF URINE PIGMENT   Protein, ur   (*)    Value: TEST NOT REPORTED DUE TO COLOR INTERFERENCE OF URINE PIGMENT   Nitrite   (*)    Value: TEST NOT  REPORTED DUE TO COLOR INTERFERENCE OF URINE PIGMENT   Leukocytes,Ua   (*)    Value: TEST NOT REPORTED DUE TO COLOR INTERFERENCE OF URINE PIGMENT   RBC / HPF >50 (*)    All other components within normal limits  COMPREHENSIVE METABOLIC PANEL - Abnormal; Notable for the following components:   Calcium 8.7 (*)    Albumin 3.4 (*)    All other components within normal limits  CBC WITH DIFFERENTIAL/PLATELET    RADIOLOGY  Official radiology report(s): US Venous Img Lower Unilateral Left  Result Date: 05/16/2019 CLINICAL DATA:  Lower extremity pain and edema EXAM: LEFT LOWER EXTREMITY VENOUS DUPLEX ULTRASOUND TECHNIQUE: Gray-scale sonography with  graded compression, as well as color Doppler and duplex ultrasound were performed to evaluate the left lower extremity deep venous system from the level of the common femoral vein and including the common femoral, femoral, profunda femoral, popliteal and calf veins including the posterior tibial, peroneal and gastrocnemius veins when visible. The superficial great saphenous vein was also interrogated. Spectral Doppler was utilized to evaluate flow at rest and with distal augmentation maneuvers in the common femoral, femoral and popliteal veins. COMPARISON:  None. FINDINGS: Contralateral Common Femoral Vein: Respiratory phasicity is normal and symmetric with the symptomatic side. No evidence of thrombus. Normal compressibility. Common Femoral Vein: No evidence of thrombus. Normal compressibility, respiratory phasicity and response to augmentation. Saphenofemoral Junction: No evidence of thrombus. Normal compressibility and flow on color Doppler imaging. Profunda Femoral Vein: No evidence of thrombus. Normal compressibility and flow on color Doppler imaging. Femoral Vein: No evidence of thrombus. Normal compressibility, respiratory phasicity and response to augmentation. Popliteal Vein: No evidence of thrombus. Normal compressibility, respiratory phasicity and  response to augmentation. Calf Veins: No evidence of thrombus. Normal compressibility and flow on color Doppler imaging. Superficial Great Saphenous Vein: No evidence of thrombus. Normal compressibility. Venous Reflux:  None. Other Findings:  None. IMPRESSION: No evidence of deep venous thrombosis in the left lower extremity. Right common femoral vein also patent. Electronically Signed   By: Bretta Bang III M.D.   On: 05/16/2019 12:01    ____________________________________________   PROCEDURES  Procedure(s) performed (including Critical Care):  Procedures  ____________________________________________   INITIAL IMPRESSION / ASSESSMENT AND PLAN / ED COURSE  As part of my medical decision making, I reviewed the following data within the electronic MEDICAL RECORD NUMBER Notes from prior ED visits and [place] Controlled Substance Database  35 year old female presents to the ED with complaint of peripheral lower extremity edema noted more on the left side than on the right.  Patient denies any recent injury, traveling or history of DVT.  Ultrasound is negative for evidence of DVT patient has some generalized mild pitting edema noted from her sock on her leg.  Pulses present.  Skin is intact.  No erythema or warmth is noted.  Denna Haggard' sign was negative.  Lab work was negative and urinalysis showed RBC secondary to patient's menses.  Patient has low suspicion for DVT and more a peripheral edema secondary to her BMI being 52.  Patient is encouraged to elevate her legs often.  Patient was started on hydrochlorothiazide 25 mg 1 daily.  She is aware that this is a fluid pill and may cause frequent urination.  She will also start watching the foods and drinks that she consumes and the amount of sodium in them.  She is to follow-up with her PCP for further work-up if she continues to have problems.  ____________________________________________   FINAL CLINICAL IMPRESSION(S) / ED DIAGNOSES  Final diagnoses:    Peripheral edema     ED Discharge Orders         Ordered    hydrochlorothiazide (HYDRODIURIL) 25 MG tablet  Daily     05/16/19 1358           Note:  This document was prepared using Dragon voice recognition software and may include unintentional dictation errors.    Tommi Rumps, PA-C 05/16/19 1509    Sharyn Creamer, MD 05/16/19 [phone]

## 2019-05-16 NOTE — ED Triage Notes (Signed)
Pt to ED via POV c/o swelling in both legs, left worse than right. Pt reports that left leg feels warm and is painful. Pt denies hx/o blood clots. Pt is in NAD.

## 2019-10-13 NOTE — Patient Instructions (Addendum)
Nice meeting you, Marly!  We will let you know about your lab results later this week.  Take care! - Janett Billow  Edema  Edema is when you have too much fluid in your body or under your skin. Edema may make your legs, feet, and ankles swell up. Swelling is also common in looser tissues, like around your eyes. This is a common condition. It gets more common as you get older. There are many possible causes of edema. Eating too much salt (sodium) and being on your feet or sitting for a long time can cause edema in your legs, feet, and ankles. Hot weather may make edema worse. Edema is usually painless. Your skin may look swollen or shiny. Follow these instructions at home:  Keep the swollen body part raised (elevated) above the level of your heart when you are sitting or lying down.  Do not sit still or stand for a long time.  Do not wear tight clothes. Do not wear garters on your upper legs.  Exercise your legs. This can help the swelling go down.  Wear elastic bandages or support stockings as told by your doctor.  Eat a low-salt (low-sodium) diet to reduce fluid as told by your doctor.  Depending on the cause of your swelling, you may need to limit how much fluid you drink (fluid restriction).  Take over-the-counter and prescription medicines only as told by your doctor. Contact a doctor if:  Treatment is not working.  You have heart, liver, or kidney disease and have symptoms of edema.  You have sudden and unexplained weight gain. Get help right away if:  You have shortness of breath or chest pain.  You cannot breathe when you lie down.  You have pain, redness, or warmth in the swollen areas.  You have heart, liver, or kidney disease and get edema all of a sudden.  You have a fever and your symptoms get worse all of a sudden. Summary  Edema is when you have too much fluid in your body or under your skin.  Edema may make your legs, feet, and ankles swell up. Swelling is also  common in looser tissues, like around your eyes.  Raise (elevate) the swollen body part above the level of your heart when you are sitting or lying down.  Follow your doctor's instructions about diet and how much fluid you can drink (fluid restriction). This information is not intended to replace advice given to you by your health care provider. Make sure you discuss any questions you have with your health care provider. Document Revised: 01/26/2017 Document Reviewed: 02/11/2016 Elsevier Patient Education  2020 [place].   Back Exercises These exercises help to make your trunk and back strong. They also help to keep the lower back flexible. Doing these exercises can help to prevent back pain or lessen existing pain.  If you have back pain, try to do these exercises 2-3 times each day or as told by your doctor.  As you get better, do the exercises once each day. Repeat the exercises more often as told by your doctor.  To stop back pain from coming back, do the exercises once each day, or as told by your doctor. Exercises Single knee to chest Do these steps 3-5 times in a row for each leg: 1. Lie on your back on a firm bed or the floor with your legs stretched out. 2. Bring one knee to your chest. 3. Grab your knee or thigh with both hands  and hold them it in place. 4. Pull on your knee until you feel a gentle stretch in your lower back or buttocks. 5. Keep doing the stretch for 10-30 seconds. 6. Slowly let go of your leg and straighten it. Pelvic tilt Do these steps 5-10 times in a row: 1. Lie on your back on a firm bed or the floor with your legs stretched out. 2. Bend your knees so they point up to the ceiling. Your feet should be flat on the floor. 3. Tighten your lower belly (abdomen) muscles to press your lower back against the floor. This will make your tailbone point up to the ceiling instead of pointing down to your feet or the floor. 4. Stay in this position for 5-10  seconds while you gently tighten your muscles and breathe evenly. Cat-cow Do these steps until your lower back bends more easily: 1. Get on your hands and knees on a firm surface. Keep your hands under your shoulders, and keep your knees under your hips. You may put padding under your knees. 2. Let your head hang down toward your chest. Tighten (contract) the muscles in your belly. Point your tailbone toward the floor so your lower back becomes rounded like the back of a cat. 3. Stay in this position for 5 seconds. 4. Slowly lift your head. Let the muscles of your belly relax. Point your tailbone up toward the ceiling so your back forms a sagging arch like the back of a cow. 5. Stay in this position for 5 seconds.  Press-ups Do these steps 5-10 times in a row: 1. Lie on your belly (face-down) on the floor. 2. Place your hands near your head, about shoulder-width apart. 3. While you keep your back relaxed and keep your hips on the floor, slowly straighten your arms to raise the top half of your body and lift your shoulders. Do not use your back muscles. You may change where you place your hands in order to make yourself more comfortable. 4. Stay in this position for 5 seconds. 5. Slowly return to lying flat on the floor.  Bridges Do these steps 10 times in a row: 1. Lie on your back on a firm surface. 2. Bend your knees so they point up to the ceiling. Your feet should be flat on the floor. Your arms should be flat at your sides, next to your body. 3. Tighten your butt muscles and lift your butt off the floor until your waist is almost as high as your knees. If you do not feel the muscles working in your butt and the back of your thighs, slide your feet 1-2 inches farther away from your butt. 4. Stay in this position for 3-5 seconds. 5. Slowly lower your butt to the floor, and let your butt muscles relax. If this exercise is too easy, try doing it with your arms crossed over your chest. Belly  crunches Do these steps 5-10 times in a row: 1. Lie on your back on a firm bed or the floor with your legs stretched out. 2. Bend your knees so they point up to the ceiling. Your feet should be flat on the floor. 3. Cross your arms over your chest. 4. Tip your chin a little bit toward your chest but do not bend your neck. 5. Tighten your belly muscles and slowly raise your chest just enough to lift your shoulder blades a tiny bit off of the floor. Avoid raising your body higher than that, because  it can put too much stress on your low back. 6. Slowly lower your chest and your head to the floor. Back lifts Do these steps 5-10 times in a row: 1. Lie on your belly (face-down) with your arms at your sides, and rest your forehead on the floor. 2. Tighten the muscles in your legs and your butt. 3. Slowly lift your chest off of the floor while you keep your hips on the floor. Keep the back of your head in line with the curve in your back. Look at the floor while you do this. 4. Stay in this position for 3-5 seconds. 5. Slowly lower your chest and your face to the floor. Contact a doctor if:  Your back pain gets a lot worse when you do an exercise.  Your back pain does not get better 2 hours after you exercise. If you have any of these problems, stop doing the exercises. Do not do them again unless your doctor says it is okay. Get help right away if:  You have sudden, very bad back pain. If this happens, stop doing the exercises. Do not do them again unless your doctor says it is okay. This information is not intended to replace advice given to you by your health care provider. Make sure you discuss any questions you have with your health care provider. Document Revised: 10/18/2017 Document Reviewed: 10/18/2017 Elsevier Patient Education  2020 [place] 21-42 Years Old, Female Preventive care refers to visits with your health care provider and lifestyle choices that can  promote health and wellness. This includes:  A yearly physical exam. This may also be called an annual well check.  Regular dental visits and eye exams.  Immunizations.  Screening for certain conditions.  Healthy lifestyle choices, such as eating a healthy diet, getting regular exercise, not using drugs or products that contain nicotine and tobacco, and limiting alcohol use. What can I expect for my preventive care visit? Physical exam Your health care provider will check your:  Height and weight. This may be used to calculate body mass index (BMI), which tells if you are at a healthy weight.  Heart rate and blood pressure.  Skin for abnormal spots. Counseling Your health care provider may ask you questions about your:  Alcohol, tobacco, and drug use.  Emotional well-being.  Home and relationship well-being.  Sexual activity.  Eating habits.  Work and work Statistician.  Method of birth control.  Menstrual cycle.  Pregnancy history. What immunizations do I need?  Influenza (flu) vaccine  This is recommended every year. Tetanus, diphtheria, and pertussis (Tdap) vaccine  You may need a Td booster every 10 years. Varicella (chickenpox) vaccine  You may need this if you have not been vaccinated. Human papillomavirus (HPV) vaccine  If recommended by your health care provider, you may need three doses over 6 months. Measles, mumps, and rubella (MMR) vaccine  You may need at least one dose of MMR. You may also need a second dose. Meningococcal conjugate (MenACWY) vaccine  One dose is recommended if you are age 11-21 years and a first-year college student living in a residence hall, or if you have one of several medical conditions. You may also need additional booster doses. Pneumococcal conjugate (PCV13) vaccine  You may need this if you have certain conditions and were not previously vaccinated. Pneumococcal polysaccharide (PPSV23) vaccine  You may need one  or two doses if you smoke cigarettes or if you have certain conditions.  Hepatitis A vaccine  You may need this if you have certain conditions or if you travel or work in places where you may be exposed to hepatitis A. Hepatitis B vaccine  You may need this if you have certain conditions or if you travel or work in places where you may be exposed to hepatitis B. Haemophilus influenzae type b (Hib) vaccine  You may need this if you have certain conditions. You may receive vaccines as individual doses or as more than one vaccine together in one shot (combination vaccines). Talk with your health care provider about the risks and benefits of combination vaccines. What tests do I need?  Blood tests  Lipid and cholesterol levels. These may be checked every 5 years starting at age 6.  Hepatitis C test.  Hepatitis B test. Screening  Diabetes screening. This is done by checking your blood sugar (glucose) after you have not eaten for a while (fasting).  Sexually transmitted disease (STD) testing.  BRCA-related cancer screening. This may be done if you have a family history of breast, ovarian, tubal, or peritoneal cancers.  Pelvic exam and Pap test. This may be done every 3 years starting at age 64. Starting at age 50, this may be done every 5 years if you have a Pap test in combination with an HPV test. Talk with your health care provider about your test results, treatment options, and if necessary, the need for more tests. Follow these instructions at home: Eating and drinking   Eat a diet that includes fresh fruits and vegetables, whole grains, lean protein, and low-fat dairy.  Take vitamin and mineral supplements as recommended by your health care provider.  Do not drink alcohol if: ? Your health care provider tells you not to drink. ? You are pregnant, may be pregnant, or are planning to become pregnant.  If you drink alcohol: ? Limit how much you have to 0-1 drink a day. ? Be  aware of how much alcohol is in your drink. In the [place] one drink equals one 12 oz bottle of beer (355 mL), one 5 oz glass of wine (148 mL), or one 1 oz glass of hard liquor (44 mL). Lifestyle  Take daily care of your teeth and gums.  Stay active. Exercise for at least 30 minutes on 5 or more days each week.  Do not use any products that contain nicotine or tobacco, such as cigarettes, e-cigarettes, and chewing tobacco. If you need help quitting, ask your health care provider.  If you are sexually active, practice safe sex. Use a condom or other form of birth control (contraception) in order to prevent pregnancy and STIs (sexually transmitted infections). If you plan to become pregnant, see your health care provider for a preconception visit. What's next?  Visit your health care provider once a year for a well check visit.  Ask your health care provider how often you should have your eyes and teeth checked.  Stay up to date on all vaccines. This information is not intended to replace advice given to you by your health care provider. Make sure you discuss any questions you have with your health care provider. Document Revised: 10/04/2017 Document Reviewed: 10/04/2017 Elsevier Patient Education  2020 [employer].

## 2019-10-13 NOTE — Progress Notes (Signed)
BP 126/78    Pulse 93    Temp 98.4 F (36.9 C) (Oral)    Ht 5' 10.2" (1.783 m)    Wt (!) 420 lb 12.8 oz (190.9 kg)    LMP 09/29/2019 (Exact Date)    SpO2 100%    BMI 60.03 kg/m    Subjective:    Patient ID: Ashley Olson, female    DOB: [birth year], 34 y.o.   MRN: [id number]  HPI: Ashley Olson is a 34 y.o. female presenting on 10/14/2019 for comprehensive medical examination. Current medical complaints include:  LOWER BACK PAIN Patient reports pain in her lower back strated getting worse about 2 weeks ago when she was in the shower and she leaned back.  She has been having to sit down a lot more than usual due to severe back pain.  Reports this feels different than her back pain that she has had in the past. 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 Location: midline but radiates to each side; whole lower back Onset: sudden Severity: 0/10 - gets up to a 10/10 at times Quality: sharp, feels like an expanding  Frequency: daily until this past Saturday; has improved Radiation: none Aggravating factors: lifting heavy objects, bending, movement Alleviating factors: fetal position, cranberry juice, azo, water, tylenol/ibuprofen  Status: better Treatments attempted: fetal position, cranberry juice, azo, increased water intake, Tylenol/ibuprofen   Relief with NSAIDs?: moderate Nighttime pain:  yes Paresthesias / decreased sensation:  yes in top of feet Bowel / bladder incontinence:  no Fevers:  no Dysuria / urinary frequency:  yes Leg weakness: yes with lifting  LEG SWELLING Reports that her legs swell after working or standing a lot of them.  She went to the ED for this and was given HCTZ - reports it makes her feel loopy dizzy.  She only takes the HCTZ when she starts swelling; she has not taken it very much.  Onset: ~2 months ago Location: bilateral legs Duration: less than 1 day Severity:  mild Quality: swelling Frequency: a few times per week Alleviating factors: taking HCTZ, eating less salt, drinking more water Aggravating factors: standing on feet a lot, eating salty foods  She currently lives with: self and daughters-59 and 19 years old LMP: 09/29/2019  Depression Screen done today and results listed below:  Depression screen [facility] 2/9 10/14/2019 05/07/2018 08/21/2017 07/13/2017 07/10/2017  Decreased Interest 1 0 2 2 2   Down, Depressed, Hopeless 1 0 2 3 2   PHQ - 2 Score 2 0 4 5 4   Altered sleeping 3 0 2 2 2   Tired, decreased energy 1 1 3 3 2   Change in appetite 3 1 3 3 3   Feeling bad or failure about yourself  1 0 2 2 2   Trouble concentrating 1 1 3 2 2   Moving slowly or fidgety/restless 0 0 3 3 3   Suicidal thoughts 0 0 0 0 1  PHQ-9 Score 11 3 20 20 19   Difficult doing work/chores Somewhat difficult - - Very difficult Very difficult   GAD 7 : Generalized Anxiety Score 10/14/2019 05/07/2018 07/10/2017 06/01/2017  Nervous, Anxious, on Edge 3 2 2 2   Control/stop worrying 3 0 2 3  Worry too much - different things 3 2 1 3   Trouble relaxing 1 1 3 2   Restless 3 1 2 3   Easily annoyed or irritable 3 1 3 3   Afraid - awful might happen 1  0 1 3  Total GAD 7 Score 17 7 14 19   Anxiety Difficulty Somewhat difficult - Very difficult -   Reports that right now, she feels very anxious.  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  She feels that her good days = her bad days.  Defintey interested in talking with a therapist.   The patient does not have a history of falls. I did not complete a risk assessment for falls. A plan of care for falls was not documented.   Past Medical History:  Past Medical History:  Diagnosis Date   Anemia    Morbid obesity (HCC)    Sciatic pain     Surgical History:  Past Surgical History:  Procedure Laterality Date   APPENDECTOMY      Medications:  Current Outpatient Medications on File  Prior to Visit  Medication Sig   hydrochlorothiazide (HYDRODIURIL) 25 MG tablet Take 1 tablet (25 mg total) by mouth daily.   hydrocortisone 2.5 % ointment Apply topically 2 (two) times daily.   valACYclovir (VALTREX) 1000 MG tablet Take 1 tablet (1,000 mg total) by mouth 2 (two) times daily. (Patient not taking: Reported on 10/14/2019)   No current facility-administered medications on file prior to visit.    Allergies:  Allergies  Allergen Reactions   Amoxicillin Swelling    Social History:  Social History   Socioeconomic History   Marital status: Single    Spouse name: Not on file   Number of children: Not on file   Years of education: Not on file   Highest education level: Not on file  Occupational History   Not on file  Tobacco Use   Smoking status: Current Every Day Smoker    Types: Cigarettes    Last attempt to quit: 06/12/2017    Years since quitting: 2.3   Smokeless tobacco: Never Used   Tobacco comment: a pack every 2 weeks  Vaping Use   Vaping Use: Former  Substance and Sexual Activity   Alcohol use: Yes    Comment: Occasionally   Drug use: Not Currently   Sexual activity: Yes    Birth control/protection: Condom  Other Topics Concern   Not on file  Social History Narrative   Not on file   Social Determinants of Health   Financial Resource Strain:    Difficulty of Paying Living Expenses: Not on file  Food Insecurity:    Worried About 08/12/2017 in the Last Year: Not on file   Programme researcher, broadcasting/film/video of Food in the Last Year: Not on file  Transportation Needs:    Lack of Transportation (Medical): Not on file   Lack of Transportation (Non-Medical): Not on file  Physical Activity:    Days of Exercise per Week: Not on file   Minutes of Exercise per Session: Not on file  Stress:    Feeling of Stress : Not on file  Social Connections:    Frequency of Communication with Friends and Family: Not on file   Frequency of Social Gatherings  with Friends and Family: Not on file   Attends Religious Services: Not on file   Active Member of Clubs or Organizations: Not on file   Attends [employer] Meetings: Not on file   Marital Status: Not on file  Intimate Partner Violence:    Fear of Current or Ex-Partner: Not on file   Emotionally Abused: Not on file  Physically Abused: Not on file   Sexually Abused: Not on file   Social History   Tobacco Use  Smoking Status Current Every Day Smoker   Types: Cigarettes   Last attempt to quit: 06/12/2017   Years since quitting: 2.3  Smokeless Tobacco Never Used  Tobacco Comment   a pack every 2 weeks   Social History   Substance and Sexual Activity  Alcohol Use Yes   Comment: Occasionally    Family History:  Family History  Problem Relation Age of Onset   Hypertension Mother    Diabetes Mother    Autism Daughter    Hypertension Maternal Grandmother    Hypertension Maternal Grandfather    Heart disease Maternal Grandfather    Diabetes Maternal Grandfather    Mental illness Neg Hx     Past medical history, surgical history, medications, allergies, family history and social history reviewed with patient today and changes made to appropriate areas of the chart.   Review of Systems  Constitutional: Negative.  Negative for chills, fever, malaise/fatigue and weight loss.  HENT: Negative.  Negative for ear discharge, ear pain, sinus pain, sore throat and tinnitus.   Eyes: Negative.  Negative for blurred vision, double vision, discharge and redness.  Respiratory: Negative.  Negative for cough, shortness of breath and wheezing.   Cardiovascular: Negative.  Negative for chest pain, palpitations and leg swelling.  Gastrointestinal: Negative.  Negative for abdominal pain, blood in stool, constipation, heartburn, nausea and vomiting.  Genitourinary: Positive for dysuria and frequency. Negative for flank pain, hematuria and urgency.  Musculoskeletal:  Positive for back pain. Negative for joint pain, myalgias and neck pain.  Skin: Negative.  Negative for itching and rash.  Neurological: Negative.  Negative for dizziness, weakness and headaches.  Psychiatric/Behavioral: Positive for depression. Negative for suicidal ideas. The patient is nervous/anxious. The patient does not have insomnia.    All other ROS negative except what is listed above and in the HPI.      Objective:    BP 126/78    Pulse 93    Temp 98.4 F (36.9 C) (Oral)    Ht 5' 10.2" (1.783 m)    Wt (!) 420 lb 12.8 oz (190.9 kg)    LMP 09/29/2019 (Exact Date)    SpO2 100%    BMI 60.03 kg/m   Wt Readings from Last 3 Encounters:  10/14/19 (!) 420 lb 12.8 oz (190.9 kg)  05/16/19 (!) 385 lb (174.6 kg)  05/28/18 (!) 385 lb (174.6 kg)    Physical Exam Vitals and nursing note reviewed. Exam conducted with a chaperone present.  Constitutional:      General: She is not in acute distress.    Appearance: Normal appearance. She is obese. She is not ill-appearing or toxic-appearing.  HENT:     Head: Normocephalic and atraumatic.     Right Ear: Tympanic membrane, ear canal and external ear normal. There is no impacted cerumen.     Left Ear: Tympanic membrane, ear canal and external ear normal. There is no impacted cerumen.     Nose: Nose normal. No congestion or rhinorrhea.     Mouth/Throat:     Mouth: Mucous membranes are moist.     Pharynx: Oropharynx is clear. No oropharyngeal exudate.  Eyes:     General: No scleral icterus.    Extraocular Movements: Extraocular movements intact.     Pupils: Pupils are equal, round, and reactive to light.  Cardiovascular:     Rate and Rhythm: Normal  rate and regular rhythm.     Pulses: Normal pulses.     Heart sounds: Normal heart sounds. No murmur heard.   Pulmonary:     Effort: Pulmonary effort is normal. No respiratory distress.     Breath sounds: No wheezing or rhonchi.  Chest:     Breasts:        Right: Normal. No inverted nipple,  mass, nipple discharge or skin change.        Left: Normal. No inverted nipple, mass, nipple discharge or skin change.  Abdominal:     General: Abdomen is flat. Bowel sounds are normal. There is no distension.     Palpations: Abdomen is soft.     Tenderness: There is no abdominal tenderness.  Genitourinary:    General: Normal vulva.     Exam position: Lithotomy position.     Pubic Area: No rash.      Labia:        Right: No rash or lesion.        Left: No rash or lesion.   Musculoskeletal:        General: No swelling or tenderness. Normal range of motion.     Cervical back: Normal range of motion and neck supple. No rigidity or tenderness.     Right lower leg: No edema.     Left lower leg: No edema.  Lymphadenopathy:     Lower Body: No right inguinal adenopathy. No left inguinal adenopathy.  Skin:    General: Skin is warm and dry.     Capillary Refill: Capillary refill takes less than 2 seconds.     Coloration: Skin is not jaundiced or pale.  Neurological:     General: No focal deficit present.     Mental Status: She is alert and oriented to person, place, and time.     Motor: No weakness.     Gait: Gait normal.  Psychiatric:        Mood and Affect: Mood normal.        Behavior: Behavior normal.        Thought Content: Thought content normal.        Judgment: Judgment normal.       Assessment & Plan:   Problem List Items Addressed This Visit      Other   BMI 60.0-69.9, adult (HCC)    Currently trying to lose weight, has given up fast food and is making all meals at home.  Has been baking and eating a lot more vegetables.  Congratulated for positive lifestyle changes and encouraged continuance.  Consider addition of 30 minutes of physical activity 5 times weekly.       Depression, recurrent (HCC)    Chronic, ongoing.  PHQ-9 elevated today and patient admits poor control of mood lately.  No SI/HI.  Does not desire medication at this time, willing to speak with Counselor.   Will place referral to psychology.      Relevant Orders   Ambulatory referral to Psychology   GAD (generalized anxiety disorder)    Chronic, ongoing.  GAD-7 elevated today and patient admits poor control of mood lately.  No SI/HI.  Does not desire medication at this time, willing to speak with Counselor.  Will place referral to psychology.      Relevant Orders   Ambulatory referral to Psychology   Screen for STD (sexually transmitted disease)    Per patient request - no areas of concern today. Discussed HSV results at length with  patient - likely will always be positive even if lesion/outbreak never present.  Discussed likelihood of spreading HSV to partners in future is not high, however condom use can highly limit these chances.  Will also check RPR, gonorrhea, chlamydia, and HIV.      Relevant Orders   HSV(herpes simplex vrs) 1+2 ab-IgG   HIV Antibody (routine testing w rflx)   RPR   Acute midline low back pain without sciatica    Acute, ongoing.  Unclear etiology although muscle strain seems likely with heavy lifting at work and pain with certain positions.  Will give Toradol 60mg  IM today, start consistent back exercises and core strengthening.  UA checked today.  If not better, consider physical therapy.      Relevant Orders   UA/M w/rflx Culture, Routine   Leg swelling    Chronic, intermittent problem.  Previously seen in ED for same and no evidence of DVT.  No swelling on examination today.  HCTZ seems to help, but does not like that it makes her dizzy and lightheaded.  Discussed limiting salt intake in diet, increasing water, elevating legs, and using compression to return fluid to circulation.  If no success with non-pharmacological interventions, encouraged using HCTZ 1/2 tablet as needed and monitor for dizziness.  If persistent problem, return to clinic.       Other Visit Diagnoses    Annual physical exam    -  Primary   Screening for thyroid disorder       Relevant  Orders   TSH   Encounter for lipid screening for cardiovascular disease       Relevant Orders   Lipid Panel w/o Chol/HDL Ratio   Cervical cancer screening       Relevant Orders   Cytology - PAP       Follow up plan: Return for pending lab work results.   LABORATORY TESTING:  - Pap smear: pap done  IMMUNIZATIONS:   - Tdap: Tetanus vaccination status reviewed: last tetanus booster within 10 years. - Influenza: Refused - Pneumovax: Not applicable - Prevnar: Not applicable - HPV: Not applicable - Zostavax vaccine: Not applicable  SCREENING: -Mammogram: Not applicable  - Colonoscopy: Not applicable  - Bone Density: Not applicable  -Hearing Test: Not applicable  -Spirometry: Not applicable   PATIENT COUNSELING:   Advised to take 1 mg of folate supplement per day if capable of pregnancy.   Sexuality: Discussed sexually transmitted diseases, partner selection, use of condoms, avoidance of unintended pregnancy  and contraceptive alternatives.   Advised to avoid cigarette smoking.  I discussed with the patient that most people either abstain from alcohol or drink within safe limits (<=14/week and <=4 drinks/occasion for males, <=7/weeks and <= 3 drinks/occasion for females) and that the risk for alcohol disorders and other health effects rises proportionally with the number of drinks per week and how often a drinker exceeds daily limits.  Discussed cessation/primary prevention of drug use and availability of treatment for abuse.   Diet: Encouraged to adjust caloric intake to maintain  or achieve ideal body weight, to reduce intake of dietary saturated fat and total fat, to limit sodium intake by avoiding high sodium foods and not adding table salt, and to maintain adequate dietary potassium and calcium preferably from fresh fruits, vegetables, and low-fat dairy products.    stressed the importance of regular exercise  Injury prevention: Discussed safety belts, safety helmets,  smoke detector, smoking near bedding or upholstery.   Dental health: Discussed importance of  regular tooth brushing, flossing, and dental visits.    NEXT PREVENTATIVE PHYSICAL DUE IN 1 YEAR. Return for pending lab work results.

## 2019-10-14 ENCOUNTER — Other Ambulatory Visit (HOSPITAL_COMMUNITY)
Admission: RE | Admit: 2019-10-14 | Discharge: 2019-10-14 | Disposition: A | Discharge status: Home / Self Care | Payer: 59 | Source: Ambulatory Visit | Attending: Nurse Practitioner | Admitting: Nurse Practitioner

## 2019-10-14 ENCOUNTER — Ambulatory Visit (INDEPENDENT_AMBULATORY_CARE_PROVIDER_SITE_OTHER): Payer: 59 | Admitting: Nurse Practitioner

## 2019-10-14 ENCOUNTER — Other Ambulatory Visit: Payer: Self-pay

## 2019-10-14 ENCOUNTER — Encounter: Payer: Self-pay | Admitting: Nurse Practitioner

## 2019-10-14 ENCOUNTER — Telehealth: Payer: Self-pay | Admitting: Nurse Practitioner

## 2019-10-14 VITALS — BP 126/78 | HR 93 | Temp 98.4°F | Ht 70.2 in | Wt >= 6400 oz

## 2019-10-14 DIAGNOSIS — Z136 Encounter for screening for cardiovascular disorders: Secondary | ICD-10-CM

## 2019-10-14 DIAGNOSIS — Z6841 Body Mass Index (BMI) 40.0 and over, adult: Secondary | ICD-10-CM

## 2019-10-14 DIAGNOSIS — Z0000 Encounter for general adult medical examination without abnormal findings: Secondary | ICD-10-CM | POA: Diagnosis not present

## 2019-10-14 DIAGNOSIS — F411 Generalized anxiety disorder: Secondary | ICD-10-CM | POA: Diagnosis not present

## 2019-10-14 DIAGNOSIS — Z1329 Encounter for screening for other suspected endocrine disorder: Secondary | ICD-10-CM

## 2019-10-14 DIAGNOSIS — Z124 Encounter for screening for malignant neoplasm of cervix: Secondary | ICD-10-CM

## 2019-10-14 DIAGNOSIS — Z113 Encounter for screening for infections with a predominantly sexual mode of transmission: Secondary | ICD-10-CM

## 2019-10-14 DIAGNOSIS — M545 Low back pain, unspecified: Secondary | ICD-10-CM | POA: Insufficient documentation

## 2019-10-14 DIAGNOSIS — Z13220 Encounter for screening for lipoid disorders: Secondary | ICD-10-CM | POA: Diagnosis not present

## 2019-10-14 DIAGNOSIS — M7989 Other specified soft tissue disorders: Secondary | ICD-10-CM | POA: Insufficient documentation

## 2019-10-14 DIAGNOSIS — F339 Major depressive disorder, recurrent, unspecified: Secondary | ICD-10-CM | POA: Diagnosis not present

## 2019-10-14 MED ORDER — KETOROLAC TROMETHAMINE 60 MG/2ML IM SOLN
60.0000 mg | Freq: Once | INTRAMUSCULAR | Status: AC
  Administered 2019-10-14: 60 mg via INTRAMUSCULAR

## 2019-10-14 MED ORDER — NITROFURANTOIN MONOHYD MACRO 100 MG PO CAPS: 100.0000 mg | ORAL_CAPSULE | Freq: Two times a day (BID) | ORAL | 0 refills | Status: AC

## 2019-10-14 NOTE — Assessment & Plan Note (Signed)
Acute, ongoing.  Unclear etiology although muscle strain seems likely with heavy lifting at work and pain with certain positions.  Will give Toradol 60mg  IM today, start consistent back exercises and core strengthening.  UA checked today.  If not better, consider physical therapy.

## 2019-10-14 NOTE — Assessment & Plan Note (Signed)
Chronic, ongoing.  GAD-7 elevated today and patient admits poor control of mood lately.  No SI/HI.  Does not desire medication at this time, willing to speak with Counselor.  Will place referral to psychology.

## 2019-10-14 NOTE — Assessment & Plan Note (Addendum)
Chronic, ongoing.  PHQ-9 elevated today and patient admits poor control of mood lately.  No SI/HI.  Does not desire medication at this time, willing to speak with Counselor.  Will place referral to psychology.

## 2019-10-14 NOTE — Assessment & Plan Note (Signed)
Per patient request - no areas of concern today. Discussed HSV results at length with patient - likely will always be positive even if lesion/outbreak never present.  Discussed likelihood of spreading HSV to partners in future is not high, however condom use can highly limit these chances.  Will also check RPR, gonorrhea, chlamydia, and HIV.

## 2019-10-14 NOTE — Assessment & Plan Note (Signed)
Currently trying to lose weight, has given up fast food and is making all meals at home.  Has been baking and eating a lot more vegetables.  Congratulated for positive lifestyle changes and encouraged continuance.  Consider addition of 30 minutes of physical activity 5 times weekly.

## 2019-10-14 NOTE — Assessment & Plan Note (Signed)
Chronic, intermittent problem.  Previously seen in ED for same and no evidence of DVT.  No swelling on examination today.  HCTZ seems to help, but does not like that it makes her dizzy and lightheaded.  Discussed limiting salt intake in diet, increasing water, elevating legs, and using compression to return fluid to circulation.  If no success with non-pharmacological interventions, encouraged using HCTZ 1/2 tablet as needed and monitor for dizziness.  If persistent problem, return to clinic.

## 2019-10-14 NOTE — Telephone Encounter (Signed)
Called patient to discuss urinalysis results.  She did not answer; left message to return call.  If she calls back, please let her know that it does look like she has an acute urinary tract infection based on her urine testing in office today.  I have sent over an antibiotic for her to start taking to help with this - [facility] in [place].  We will send her urine off for culture to be sure the antibiotic we chose is the correct one for the bacteria and we will let her know if we need to change the antibiotic later this week.

## 2019-10-15 LAB — COMPREHENSIVE METABOLIC PANEL
ALT: 14 IU/L (ref 0–32)
AST: 15 IU/L (ref 0–40)
Albumin/Globulin Ratio: 1.4 (ref 1.2–2.2)
Albumin: 4 g/dL (ref 3.8–4.8)
Alkaline Phosphatase: 80 IU/L (ref 48–121)
BUN/Creatinine Ratio: 14 (ref 9–23)
BUN: 10 mg/dL (ref 6–20)
Bilirubin Total: 0.2 mg/dL (ref 0.0–1.2)
CO2: 22 mmol/L (ref 20–29)
Calcium: 8.9 mg/dL (ref 8.7–10.2)
Chloride: 104 mmol/L (ref 96–106)
Creatinine, Ser: 0.74 mg/dL (ref 0.57–1.00)
GFR calc Af Amer: 123 mL/min/{1.73_m2} (ref >59)
GFR calc non Af Amer: 107 mL/min/{1.73_m2} (ref >59)
Globulin, Total: 2.8 g/dL (ref 1.5–4.5)
Glucose: 86 mg/dL (ref 65–99)
Potassium: 4.2 mmol/L (ref 3.5–5.2)
Sodium: 137 mmol/L (ref 134–144)
Total Protein: 6.8 g/dL (ref 6.0–8.5)

## 2019-10-15 LAB — CBC WITH DIFFERENTIAL/PLATELET
Basophils Absolute: 0 10*3/uL (ref 0.0–0.2)
Basos: 1 %
EOS (ABSOLUTE): 0.3 10*3/uL (ref 0.0–0.4)
Eos: 3 %
Hematocrit: 38.8 % (ref 34.0–46.6)
Hemoglobin: 12.7 g/dL (ref 11.1–15.9)
Immature Grans (Abs): 0.1 10*3/uL (ref 0.0–0.1)
Immature Granulocytes: 1 %
Lymphocytes Absolute: 2.6 10*3/uL (ref 0.7–3.1)
Lymphs: 32 %
MCH: 27.3 pg (ref 26.6–33.0)
MCHC: 32.7 g/dL (ref 31.5–35.7)
MCV: 83 fL (ref 79–97)
Monocytes Absolute: 0.6 10*3/uL (ref 0.1–0.9)
Monocytes: 8 %
Neutrophils Absolute: 4.6 10*3/uL (ref 1.4–7.0)
Neutrophils: 55 %
Platelets: 309 10*3/uL (ref 150–450)
RBC: 4.65 x10E6/uL (ref 3.77–5.28)
RDW: 14.6 % (ref 11.7–15.4)
WBC: 8.1 10*3/uL (ref 3.4–10.8)

## 2019-10-15 LAB — HSV(HERPES SIMPLEX VRS) I + II AB-IGG
HSV 1 Glycoprotein G Ab, IgG: <0.91 index (ref 0.00–0.90)
HSV 2 IgG, Type Spec: 5.69 index — ABNORMAL HIGH (ref 0.00–0.90)

## 2019-10-15 LAB — RPR: RPR Ser Ql: NONREACTIVE

## 2019-10-15 LAB — LIPID PANEL W/O CHOL/HDL RATIO
Cholesterol, Total: 130 mg/dL (ref 100–199)
HDL: 60 mg/dL (ref >39)
LDL Chol Calc (NIH): 53 mg/dL (ref 0–99)
Triglycerides: 90 mg/dL (ref 0–149)
VLDL Cholesterol Cal: 17 mg/dL (ref 5–40)

## 2019-10-15 LAB — TSH: TSH: 1.71 u[IU]/mL (ref 0.450–4.500)

## 2019-10-15 LAB — HIV ANTIBODY (ROUTINE TESTING W REFLEX): HIV Screen 4th Generation wRfx: NONREACTIVE

## 2019-10-15 NOTE — Telephone Encounter (Signed)
Tried calling patient, no answer and VM was full. Will try to call again later.

## 2019-10-16 ENCOUNTER — Encounter: Payer: Self-pay | Admitting: Nurse Practitioner

## 2019-10-16 LAB — CYTOLOGY - PAP
Chlamydia: NEGATIVE
Comment: NEGATIVE
Comment: NEGATIVE
Comment: NEGATIVE
Comment: NORMAL
Diagnosis: NEGATIVE
High risk HPV: NEGATIVE
Neisseria Gonorrhea: NEGATIVE
Trichomonas: NEGATIVE

## 2019-10-16 NOTE — Telephone Encounter (Signed)
Tried calling patient, no answer and VM box full.

## 2019-10-16 NOTE — Telephone Encounter (Signed)
See mychart message. Patient aware of antibiotics.

## 2019-10-19 LAB — UA/M W/RFLX CULTURE, ROUTINE
Bilirubin, UA: NEGATIVE
Glucose, UA: NEGATIVE
Ketones, UA: NEGATIVE
Nitrite, UA: POSITIVE — AB
Protein,UA: NEGATIVE
Specific Gravity, UA: <1.005 — ABNORMAL LOW (ref 1.005–1.030)
Urobilinogen, Ur: 0.2 mg/dL (ref 0.2–1.0)
pH, UA: 6 (ref 5.0–7.5)

## 2019-10-19 LAB — URINE CULTURE, REFLEX

## 2019-10-19 LAB — MICROSCOPIC EXAMINATION

## 2020-05-09 IMAGING — US US EXTREM LOW VENOUS*L*
1 series · 15 of 24 positions shown · non-contrast
Comparison: None.

CLINICAL DATA: Lower extremity pain and edema

EXAM:
LEFT LOWER EXTREMITY VENOUS DUPLEX ULTRASOUND
TECHNIQUE: Gray-scale sonography with graded compression, as well as color
Doppler and duplex ultrasound were performed to evaluate the left
lower extremity deep venous system from the level of the common
femoral vein and including the common femoral, femoral, profunda
femoral, popliteal and calf veins including the posterior tibial,
peroneal and gastrocnemius veins when visible. The superficial great
saphenous vein was also interrogated. Spectral Doppler was utilized
to evaluate flow at rest and with distal augmentation maneuvers in
the common femoral, femoral and popliteal veins.

[Series 1: us extrem low venous*left* · 15 of 32 slices shown]
[im 1/32]
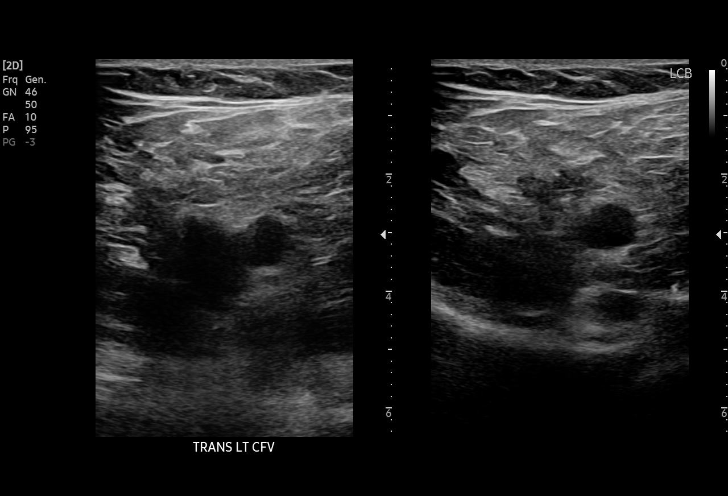
[im 3/32]
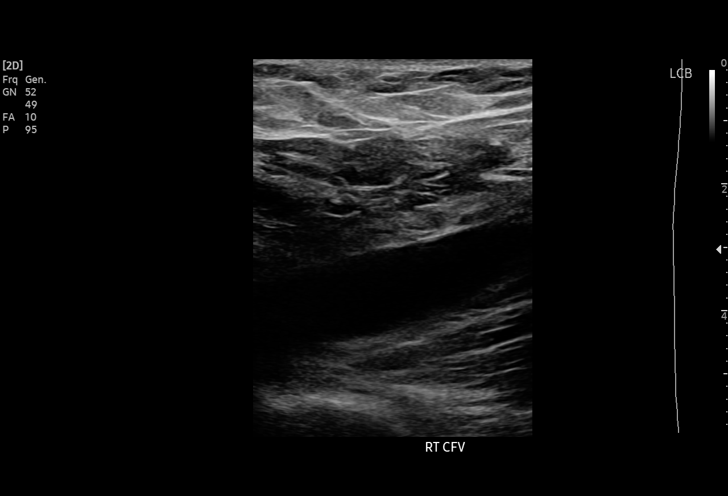
[im 6/32]
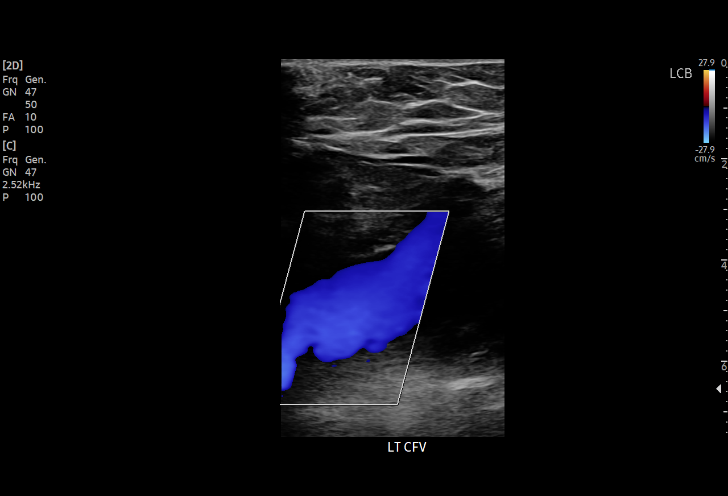
[im 7/32]
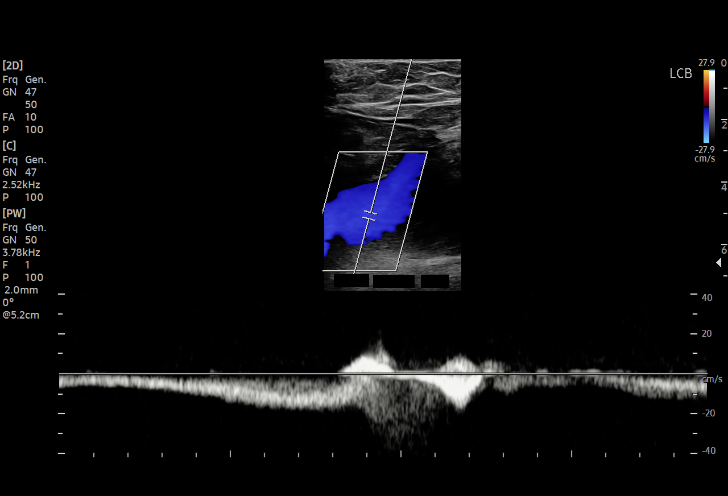
[im 10/32]
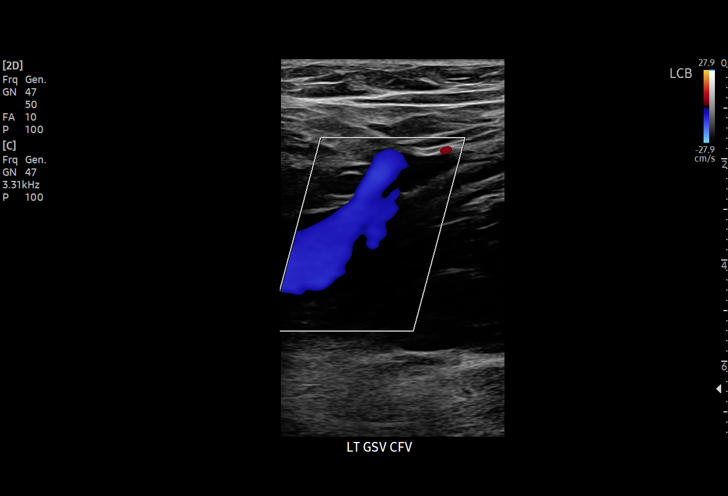
[im 11/32]
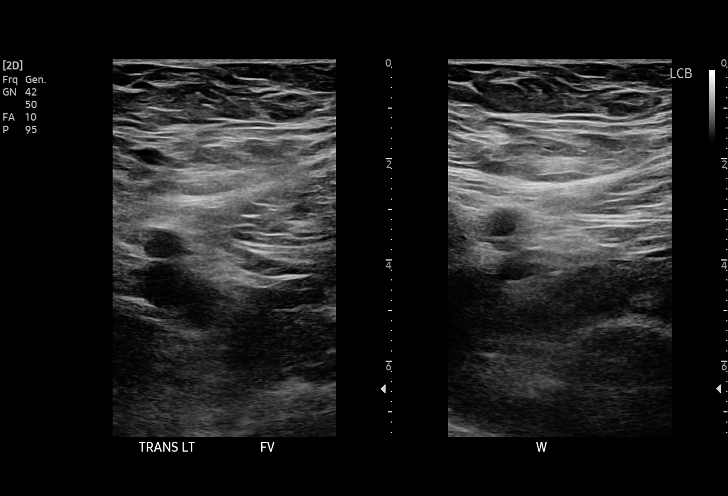
[im 14/32]
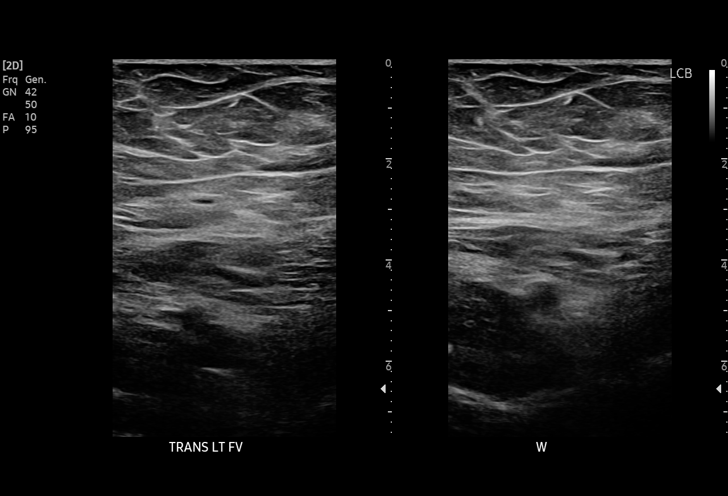
[im 17/32]
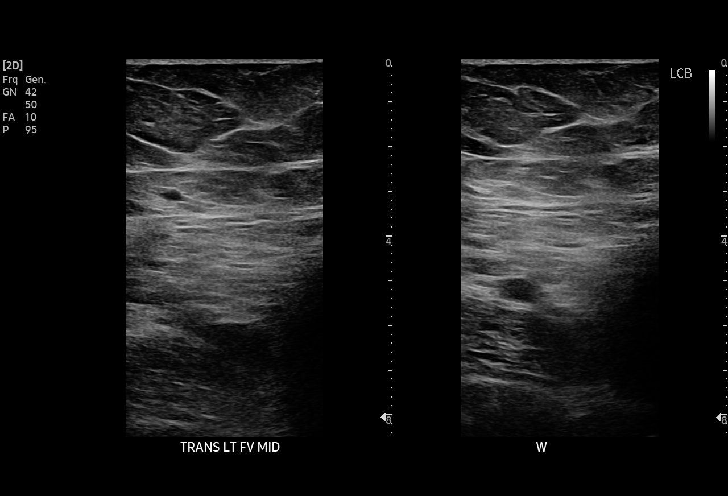
[im 18/32]
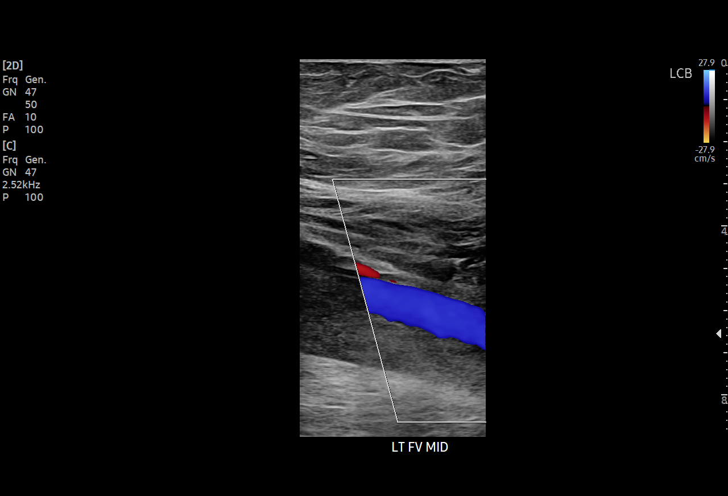
[im 21/32]
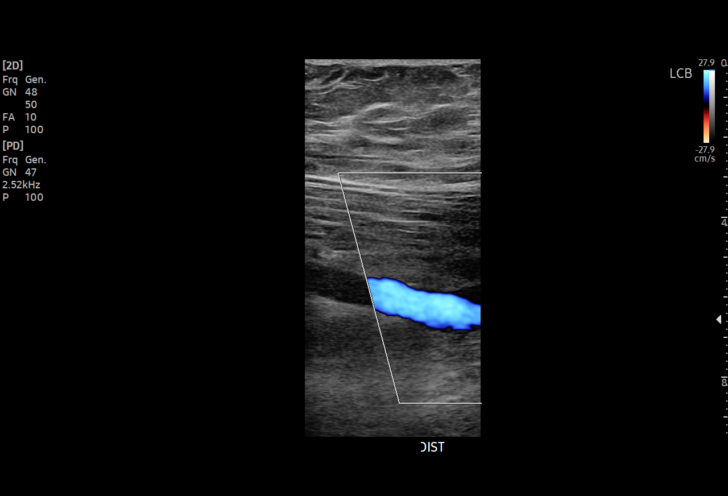
[im 22/32]
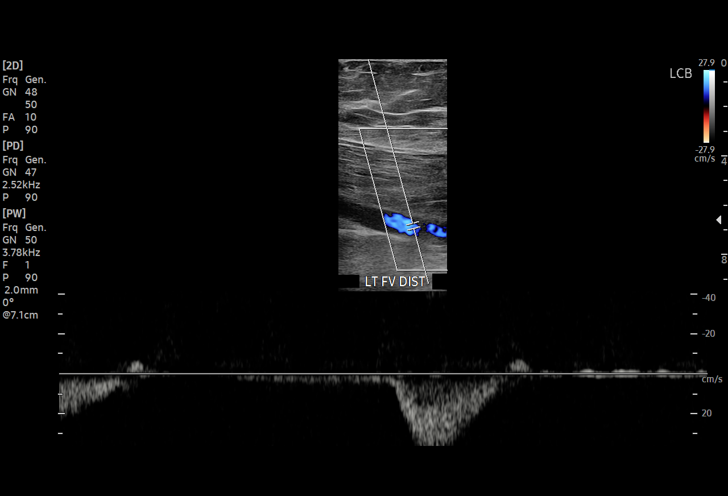
[im 25/32]
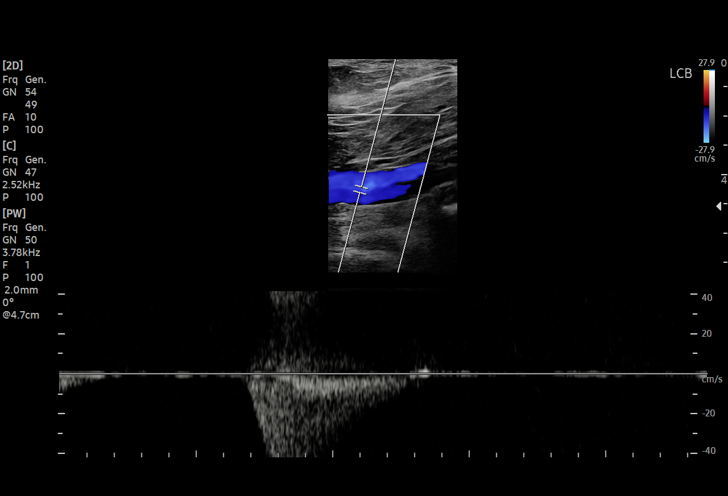
[im 27/32]
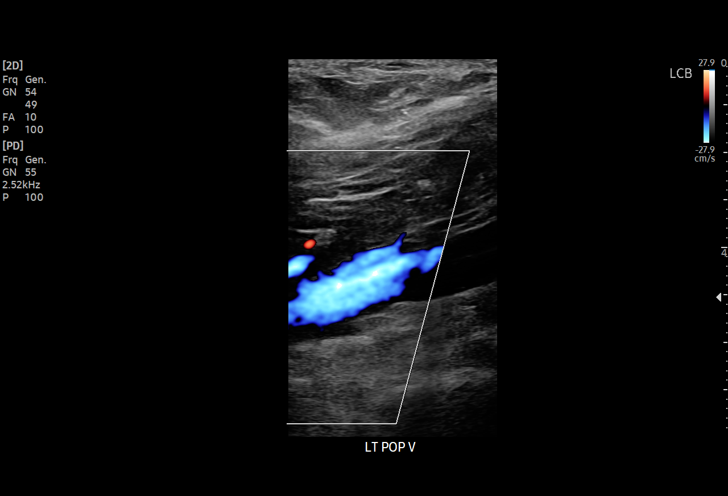
[im 29/32]
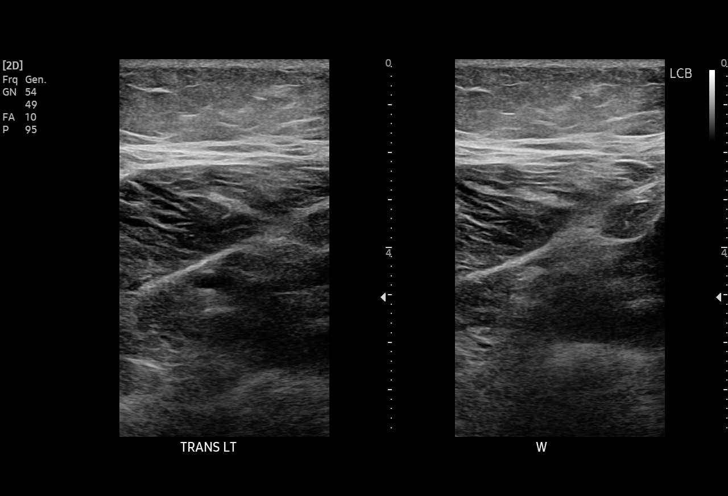
[im 32/32]
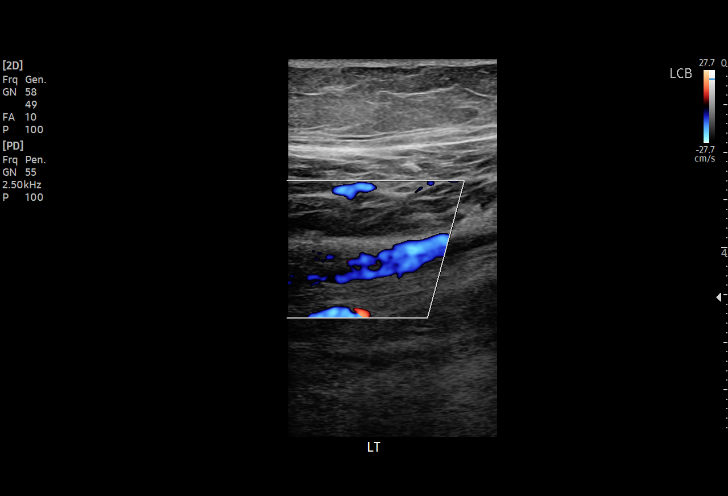

[15 of 24 positions shown; findings below may reference images not displayed]

FINDINGS: Contralateral Common Femoral Vein: Respiratory phasicity is normal
and symmetric with the symptomatic side. No evidence of thrombus.
Normal compressibility.

Common Femoral Vein: No evidence of thrombus. Normal
compressibility, respiratory phasicity and response to augmentation.

Saphenofemoral Junction: No evidence of thrombus. Normal
compressibility and flow on color Doppler imaging.

Profunda Femoral Vein: No evidence of thrombus. Normal
compressibility and flow on color Doppler imaging.

Femoral Vein: No evidence of thrombus. Normal compressibility,
respiratory phasicity and response to augmentation.

Popliteal Vein: No evidence of thrombus. Normal compressibility,
respiratory phasicity and response to augmentation.

Calf Veins: No evidence of thrombus. Normal compressibility and flow
on color Doppler imaging.

Superficial Great Saphenous Vein: No evidence of thrombus. Normal
compressibility.

Venous Reflux:  None.

Other Findings:  None.
IMPRESSION: No evidence of deep venous thrombosis in the left lower extremity.
Right common femoral vein also patent.

## 2020-12-13 ENCOUNTER — Ambulatory Visit: Payer: Self-pay

## 2020-12-13 NOTE — Telephone Encounter (Signed)
Pt called in stating she is having burning sensation when she pees and is having abdominal pain. She reports pain level 9/10 and increased frequency. This has been going on for 2 weeks but went awake and came back. She says her urine is dark yellow and cloudy and slight odor. She says that she feels like sharp knives are stabbing her when she is peeing and is able to urinate sometimes a dribble or normal amount. Advised available appt for today but pt says she cant make it today she has to pick up her kids so appt made for 12/14/20 at 0900 with Dr. Charlotta Newton. Care advice given and pt verbalized understanding.   Reason for Disposition  Urinating more frequently than usual (i.e., frequency)  Answer Assessment - Initial Assessment Questions 1. SYMPTOM: "What's the main symptom you're concerned about?" (e.g., frequency, incontinence)     Burning when peeing, abdominal pain when urinating, increased frequency 2. ONSET: "When did the  symptoms  start?"     2 weeks ago, eased up and came back 3. PAIN: "Is there any pain?" If Yes, ask: "How bad is it?" (Scale: 1-10; mild, moderate, severe)     9 4. CAUSE: "What do you think is causing the symptoms?"     UTIs previously  5. OTHER SYMPTOMS: "Do you have any other symptoms?" (e.g., fever, flank pain, blood in urine, pain with urination)     Pain when urinating 6. PREGNANCY: "Is there any chance you are pregnant?" "When was your last menstrual period?"     No, last month period  Protocols used: Urinary Symptoms-A-AH

## 2020-12-14 ENCOUNTER — Encounter: Payer: Self-pay | Admitting: Internal Medicine

## 2020-12-14 ENCOUNTER — Other Ambulatory Visit: Payer: Self-pay

## 2020-12-14 ENCOUNTER — Encounter: Payer: Self-pay | Admitting: Nurse Practitioner

## 2020-12-14 ENCOUNTER — Ambulatory Visit (INDEPENDENT_AMBULATORY_CARE_PROVIDER_SITE_OTHER): Payer: 59 | Admitting: Internal Medicine

## 2020-12-14 VITALS — BP 127/80 | HR 85 | Temp 98.5°F | Ht 70.08 in | Wt >= 6400 oz

## 2020-12-14 DIAGNOSIS — R300 Dysuria: Secondary | ICD-10-CM | POA: Diagnosis not present

## 2020-12-14 DIAGNOSIS — R350 Frequency of micturition: Secondary | ICD-10-CM | POA: Diagnosis not present

## 2020-12-14 LAB — URINALYSIS, ROUTINE W REFLEX MICROSCOPIC
Bilirubin, UA: NEGATIVE
Glucose, UA: NEGATIVE
Ketones, UA: NEGATIVE
Nitrite, UA: POSITIVE — AB
Specific Gravity, UA: 1.025 (ref 1.005–1.030)
Urobilinogen, Ur: 0.2 mg/dL (ref 0.2–1.0)
pH, UA: 6 (ref 5.0–7.5)

## 2020-12-14 LAB — MICROSCOPIC EXAMINATION

## 2020-12-14 MED ORDER — SULFAMETHOXAZOLE-TRIMETHOPRIM 800-160 MG PO TABS: 1.0000 | ORAL_TABLET | Freq: Two times a day (BID) | ORAL | 0 refills | Status: AC

## 2020-12-14 MED ORDER — DICLOFENAC SODIUM 1 % EX GEL: 2.0000 g | Freq: Four times a day (QID) | CUTANEOUS | 0 refills | Status: DC

## 2020-12-14 NOTE — Progress Notes (Signed)
BP 127/80   Pulse 85   Temp 98.5 F (36.9 C) (Oral)   Ht 5' 10.08" (1.78 m)   Wt (!) 411 lb 3.2 oz (186.5 kg)   SpO2 99%   BMI 58.87 kg/m    Subjective:    Patient ID: Ashley Olson, female    DOB: [birth year], 35 y.o.   MRN: [id number]  Chief Complaint  Patient presents with   Painful urination    Cloudy urine, frequency, pressure and low back pain. For the past 2 weeks     HPI: Ashley Olson is a 35 y.o. female  Patient presents with: Painful urination: Cloudy urine, frequency, pressure and low back pain. For the past 2 weeks  Some back pain   uses @ [facility] for birth control     Urinary Tract Infection  This is a new problem. The current episode started 1 to 4 weeks ago. The quality of the pain is described as burning (when shes finished peeing). The pain is at a severity of 4/10. The pain is mild. Associated symptoms include frequency, hematuria and urgency. Pertinent negatives include no chills, discharge, flank pain, hesitancy, nausea, possible pregnancy, sweats or vomiting.   Chief Complaint  Patient presents with   Painful urination    Cloudy urine, frequency, pressure and low back pain. For the past 2 weeks     Relevant past medical, surgical, family and social history reviewed and updated as indicated. Interim medical history since our last visit reviewed. Allergies and medications reviewed and updated.  Review of Systems  Constitutional:  Negative for chills.  Gastrointestinal:  Negative for nausea and vomiting.  Genitourinary:  Positive for frequency, hematuria and urgency. Negative for flank pain and hesitancy.   Per HPI unless specifically indicated above     Objective:    BP 127/80   Pulse 85   Temp 98.5 F (36.9 C) (Oral)   Ht 5' 10.08" (1.78 m)   Wt (!) 411 lb 3.2 oz (186.5 kg)   SpO2 99%   BMI 58.87 kg/m   Wt Readings from Last 3 Encounters:  12/14/20 (!) 411 lb 3.2 oz (186.5 kg)  10/14/19 (!) 420 lb 12.8 oz (190.9 kg)   05/16/19 (!) 385 lb (174.6 kg)    Physical Exam Vitals and nursing note reviewed.  Constitutional:      General: She is not in acute distress.    Appearance: Normal appearance. She is not ill-appearing or diaphoretic.  Eyes:     Conjunctiva/sclera: Conjunctivae normal.  Cardiovascular:     Rate and Rhythm: Normal rate.  Pulmonary:     Breath sounds: No rhonchi.  Abdominal:     General: Abdomen is flat. There is no distension.     Palpations: There is no mass.     Tenderness: There is no abdominal tenderness. There is no right CVA tenderness, left CVA tenderness or guarding.  Skin:    General: Skin is warm and dry.     Coloration: Skin is not jaundiced.     Findings: No erythema.  Neurological:     Mental Status: She is alert.    Results for orders placed or performed in visit on 10/14/19  Microscopic Examination   Urine  Result Value Ref Range   WBC, UA 11-30 (A) 0 - 5 /hpf   RBC 3-10 (A) 0 - 2 /hpf   Epithelial Cells (non renal) 0-10 0 - 10 /hpf   Bacteria, UA Many (A) None seen/Few  Urine Culture,  Reflex   Urine  Result Value Ref Range   Urine Culture, Routine Final report (A)    Organism ID, Bacteria Comment (A)    Antimicrobial Susceptibility Comment   CBC with Differential/Platelet  Result Value Ref Range   WBC 8.1 3.4 - 10.8 x10E3/uL   RBC 4.65 3.77 - 5.28 x10E6/uL   Hemoglobin 12.7 11.1 - 15.9 g/dL   Hematocrit 38.8 34.0 - 46.6 %   MCV 83 79 - 97 fL   MCH 27.3 26.6 - 33.0 pg   MCHC 32.7 31.5 - 35.7 g/dL   RDW 14.6 11.7 - 15.4 %   Platelets 309 150 - 450 x10E3/uL   Neutrophils 55 Not Estab. %   Lymphs 32 Not Estab. %   Monocytes 8 Not Estab. %   Eos 3 Not Estab. %   Basos 1 Not Estab. %   Neutrophils Absolute 4.6 1.4 - 7.0 x10E3/uL   Lymphocytes Absolute 2.6 0.7 - 3.1 x10E3/uL   Monocytes Absolute 0.6 0.1 - 0.9 x10E3/uL   EOS (ABSOLUTE) 0.3 0.0 - 0.4 x10E3/uL   Basophils Absolute 0.0 0.0 - 0.2 x10E3/uL   Immature Granulocytes 1 Not Estab. %    Immature Grans (Abs) 0.1 0.0 - 0.1 x10E3/uL  Comprehensive metabolic panel  Result Value Ref Range   Glucose 86 65 - 99 mg/dL   BUN 10 6 - 20 mg/dL   Creatinine, Ser 0.74 0.57 - 1.00 mg/dL   GFR calc non Af Amer 107 >59 mL/min/1.73   GFR calc Af Amer 123 >59 mL/min/1.73   BUN/Creatinine Ratio 14 9 - 23   Sodium 137 134 - 144 mmol/L   Potassium 4.2 3.5 - 5.2 mmol/L   Chloride 104 96 - 106 mmol/L   CO2 22 20 - 29 mmol/L   Calcium 8.9 8.7 - 10.2 mg/dL   Total Protein 6.8 6.0 - 8.5 g/dL   Albumin 4.0 3.8 - 4.8 g/dL   Globulin, Total 2.8 1.5 - 4.5 g/dL   Albumin/Globulin Ratio 1.4 1.2 - 2.2   Bilirubin Total 0.2 0.0 - 1.2 mg/dL   Alkaline Phosphatase 80 48 - 121 IU/L   AST 15 0 - 40 IU/L   ALT 14 0 - 32 IU/L  Lipid Panel w/o Chol/HDL Ratio  Result Value Ref Range   Cholesterol, Total 130 100 - 199 mg/dL   Triglycerides 90 0 - 149 mg/dL   HDL 60 >39 mg/dL   VLDL Cholesterol Cal 17 5 - 40 mg/dL   LDL Chol Calc (NIH) 53 0 - 99 mg/dL  TSH  Result Value Ref Range   TSH 1.710 0.450 - 4.500 uIU/mL  UA/M w/rflx Culture, Routine   Specimen: Urine   Urine  Result Value Ref Range   Specific Gravity, UA <1.005 (L) 1.005 - 1.030   pH, UA 6.0 5.0 - 7.5   Color, UA Yellow Yellow   Appearance Ur Hazy (A) Clear   Leukocytes,UA 2+ (A) Negative   Protein,UA Negative Negative/Trace   Glucose, UA Negative Negative   Ketones, UA Negative Negative   RBC, UA Trace (A) Negative   Bilirubin, UA Negative Negative   Urobilinogen, Ur 0.2 0.2 - 1.0 mg/dL   Nitrite, UA Positive (A) Negative   Microscopic Examination See below:    Urinalysis Reflex Comment   HSV(herpes simplex vrs) 1+2 ab-IgG  Result Value Ref Range   HSV 1 Glycoprotein G Ab, IgG <0.91 0.00 - 0.90 index   HSV 2 IgG, Type Spec 5.69 (H) 0.00 - 0.90  index  HIV Antibody (routine testing w rflx)  Result Value Ref Range   HIV Screen 4th Generation wRfx Non Reactive Non Reactive  RPR  Result Value Ref Range   RPR Ser Ql Non Reactive  Non Reactive  Cytology - PAP  Result Value Ref Range   High risk HPV Negative    Neisseria Gonorrhea Negative    Chlamydia Negative    Trichomonas Negative    Adequacy      Satisfactory for evaluation; transformation zone component PRESENT.   Diagnosis      - Negative for intraepithelial lesion or malignancy (NILM)   Comment Normal Reference Range HPV - Negative    Comment Normal Reference Range Trichomonas - Negative    Comment Normal Reference Ranger Chlamydia - Negative    Comment      Normal Reference Range Neisseria Gonorrhea - Negative        Current Outpatient Medications:    diclofenac Sodium (VOLTAREN) 1 % GEL, Apply 2 g topically 4 (four) times daily., Disp: 2 g, Rfl: 0   sulfamethoxazole-trimethoprim (BACTRIM DS) 800-160 MG tablet, Take 1 tablet by mouth 2 (two) times daily for 7 days., Disp: 14 tablet, Rfl: 0   hydrochlorothiazide (HYDRODIURIL) 25 MG tablet, Take 1 tablet (25 mg total) by mouth daily. (Patient not taking: Reported on 12/14/2020), Disp: 30 tablet, Rfl: 0   hydrocortisone 2.5 % ointment, Apply topically 2 (two) times daily. (Patient not taking: Reported on 12/14/2020), Disp: 30 g, Rfl: 0   valACYclovir (VALTREX) 1000 MG tablet, Take 1 tablet (1,000 mg total) by mouth 2 (two) times daily. (Patient not taking: Reported on 12/14/2020), Disp: 20 tablet, Rfl: 0    Assessment & Plan:  UTI : check UA.  pt is currently symptomatic for an Urinary tract infection(abd pain, burning etc),    encouraged to increase water/fluid intake.Signs and symptoms of emergency were discussed with the patient. The risks, benefits and side effects of treatment were discussed with the patient. The patient verbalized an understanding of plan, and was told to call the clinic/go to the ED if symptoms worsen at any point of time.  Problem List Items Addressed This Visit   None Visit Diagnoses     Frequency of urination    -  Primary   Relevant Orders   Urinalysis, Routine w reflex  microscopic   Urine Culture   Burning with urination       Relevant Orders   Urinalysis, Routine w reflex microscopic   Urine Culture        Orders Placed This Encounter  Procedures   Urine Culture   Urinalysis, Routine w reflex microscopic     Meds ordered this encounter  Medications   sulfamethoxazole-trimethoprim (BACTRIM DS) 800-160 MG tablet    Sig: Take 1 tablet by mouth 2 (two) times daily for 7 days.    Dispense:  14 tablet    Refill:  0   diclofenac Sodium (VOLTAREN) 1 % GEL    Sig: Apply 2 g topically 4 (four) times daily.    Dispense:  2 g    Refill:  0     Follow up plan: No follow-ups on file.

## 2020-12-19 LAB — URINE CULTURE

## 2021-01-10 ENCOUNTER — Other Ambulatory Visit: Payer: Self-pay | Admitting: Internal Medicine

## 2021-01-10 NOTE — Telephone Encounter (Signed)
Requested Prescriptions  Pending Prescriptions Disp Refills  . diclofenac Sodium (VOLTAREN) 1 % GEL [Pharmacy Med Name: DICLOFENAC SODIUM 1% GEL] 100 g 1    Sig: APPLY 2 GRAMS TO AFFECTED AREA 4 TIMES A DAY     Analgesics:  Topicals Passed - 01/10/2021  1:29 AM      Passed - Valid encounter within last 12 months    Recent Outpatient Visits          3 weeks ago Frequency of urination   [facility] Vigg, Avanti, MD   1 year ago Annual physical exam   [facility] Valentino Nose, NP   2 years ago Screen for STD (sexually transmitted disease)   [facility] Marjie Skiff, NP   2 years ago Dysuria   [facility] Roosvelt Maser [place], [place]   3 years ago Insomnia due to other mental disorder   [facility] Maurice March, [place], [place]      Future Appointments            In 3 days Larae Grooms, NP [facility]

## 2021-01-12 NOTE — Progress Notes (Deleted)
   There were no vitals taken for this visit.   Subjective:    Patient ID: Ashley Olson, female    DOB: 20-Oct-1985, 35 y.o.   MRN: [id number]  HPI: Ashley Olson is a 35 y.o. female  No chief complaint on file.   Relevant past medical, surgical, family and social history reviewed and updated as indicated. Interim medical history since our last visit reviewed. Allergies and medications reviewed and updated.  Review of Systems  Per HPI unless specifically indicated above     Objective:    There were no vitals taken for this visit.  Wt Readings from Last 3 Encounters:  12/14/20 (!) 411 lb 3.2 oz (186.5 kg)  10/14/19 (!) 420 lb 12.8 oz (190.9 kg)  05/16/19 (!) 385 lb (174.6 kg)    Physical Exam  Results for orders placed or performed in visit on 12/14/20  Urine Culture   Specimen: Urine   UR  Result Value Ref Range   Urine Culture, Routine Final report    Organism ID, Bacteria Comment   Microscopic Examination   Urine  Result Value Ref Range   WBC, UA 11-30 (A) 0 - 5 /hpf   RBC 3-10 (A) 0 - 2 /hpf   Epithelial Cells (non renal) 0-10 0 - 10 /hpf   Mucus, UA Present (A) Not Estab.   Bacteria, UA Moderate (A) None seen/Few  Urinalysis, Routine w reflex microscopic  Result Value Ref Range   Specific Gravity, UA 1.025 1.005 - 1.030   pH, UA 6.0 5.0 - 7.5   Color, UA Yellow Yellow   Appearance Ur Cloudy (A) Clear   Leukocytes,UA 3+ (A) Negative   Protein,UA 2+ (A) Negative/Trace   Glucose, UA Negative Negative   Ketones, UA Negative Negative   RBC, UA 2+ (A) Negative   Bilirubin, UA Negative Negative   Urobilinogen, Ur 0.2 0.2 - 1.0 mg/dL   Nitrite, UA Positive (A) Negative   Microscopic Examination See below:       Assessment & Plan:   Problem List Items Addressed This Visit   None    Follow up plan: No follow-ups on file.

## 2021-01-13 ENCOUNTER — Ambulatory Visit: Payer: 59 | Admitting: Nurse Practitioner

## 2021-01-14 ENCOUNTER — Other Ambulatory Visit: Payer: Self-pay

## 2021-01-14 ENCOUNTER — Ambulatory Visit (INDEPENDENT_AMBULATORY_CARE_PROVIDER_SITE_OTHER): Payer: 59 | Admitting: Nurse Practitioner

## 2021-01-14 ENCOUNTER — Encounter: Payer: Self-pay | Admitting: Nurse Practitioner

## 2021-01-14 VITALS — BP 113/78 | HR 81 | Temp 98.3°F | Wt >= 6400 oz

## 2021-01-14 DIAGNOSIS — G43801 Other migraine, not intractable, with status migrainosus: Secondary | ICD-10-CM | POA: Diagnosis not present

## 2021-01-14 MED ORDER — RIZATRIPTAN BENZOATE 10 MG PO TABS: 10.0000 mg | ORAL_TABLET | ORAL | 0 refills | Status: DC | PRN

## 2021-01-14 MED ORDER — KETOROLAC TROMETHAMINE 60 MG/2ML IM SOLN
60.0000 mg | Freq: Once | INTRAMUSCULAR | Status: AC
  Administered 2021-01-14: 60 mg via INTRAMUSCULAR

## 2021-01-14 NOTE — Assessment & Plan Note (Signed)
Ongoing. Has had a migraine for the last week. Will give Toradol in office today. Letter written for work. Maxalt given for abortive migraine medication treatment. Discussed how to properly use medication. Recommend blue light blocking glasses due to looking at computer screen daily.  Follow up if symptoms worsen or fail to improve.

## 2021-01-14 NOTE — Progress Notes (Signed)
BP 113/78   Pulse 81   Temp 98.3 F (36.8 C) (Oral)   Wt (!) 417 lb 3.2 oz (189.2 kg)   SpO2 97%   BMI 59.73 kg/m    Subjective:    Patient ID: Ashley Olson, female    DOB: Feb 24, 1985, 35 y.o.   MRN: [id number]  HPI: Ashley Olson is a 35 y.o. female  Chief Complaint  Patient presents with   Headache    Pt states she has been having headaches all day for the past week. States she is having trouble seeing because they lights and screens make the headaches worse. States she has tried taking Ibuprofen 800, Tylenol, Advil, and Naproxen.    Back Pain   MIGRAINES Patient states she has been having headaches all day for the last week.  Duration: weeks Onset: sudden Severity: 10/10 Quality: throbbing Frequency: constant Location: front of head Headache duration: 1 week Radiation: yes Time of day headache occurs: all day Alleviating factors:  Aggravating factors:  Headache status at time of visit: current headache Treatments attempted: Treatments attempted: tylenol ibuprofen   Aura: no Nausea:  no Vomiting: no Photophobia:  yes Phonophobia:  yes Effect on social functioning:   yes Numbers of missed days of school/work each month: a couple Confusion:  no Gait disturbance/ataxia:  no Behavioral changes:   irritable Fevers:  no    Relevant past medical, surgical, family and social history reviewed and updated as indicated. Interim medical history since our last visit reviewed. Allergies and medications reviewed and updated.  Review of Systems  Neurological:  Positive for headaches.   Per HPI unless specifically indicated above     Objective:    BP 113/78   Pulse 81   Temp 98.3 F (36.8 C) (Oral)   Wt (!) 417 lb 3.2 oz (189.2 kg)   SpO2 97%   BMI 59.73 kg/m   Wt Readings from Last 3 Encounters:  01/14/21 (!) 417 lb 3.2 oz (189.2 kg)  12/14/20 (!) 411 lb 3.2 oz (186.5 kg)  10/14/19 (!) 420 lb 12.8 oz (190.9 kg)    Physical Exam Vitals and  nursing note reviewed.  Constitutional:      General: She is not in acute distress.    Appearance: Normal appearance. She is normal weight. She is not ill-appearing, toxic-appearing or diaphoretic.  HENT:     Head: Normocephalic.     Right Ear: External ear normal.     Left Ear: External ear normal.     Nose: Nose normal.     Mouth/Throat:     Mouth: Mucous membranes are moist.     Pharynx: Oropharynx is clear.  Eyes:     General:        Right eye: No discharge.        Left eye: No discharge.     Extraocular Movements: Extraocular movements intact.     Conjunctiva/sclera: Conjunctivae normal.     Pupils: Pupils are equal, round, and reactive to light.  Cardiovascular:     Rate and Rhythm: Normal rate and regular rhythm.     Heart sounds: No murmur heard. Pulmonary:     Effort: Pulmonary effort is normal. No respiratory distress.     Breath sounds: Normal breath sounds. No wheezing or rales.  Musculoskeletal:     Cervical back: Normal range of motion and neck supple.  Skin:    General: Skin is warm and dry.     Capillary Refill: Capillary refill takes less  than 2 seconds.  Neurological:     General: No focal deficit present.     Mental Status: She is alert and oriented to person, place, and time. Mental status is at baseline.     Cranial Nerves: No cranial nerve deficit.     Motor: No weakness.     Gait: Gait normal.  Psychiatric:        Mood and Affect: Mood normal.        Behavior: Behavior normal.        Thought Content: Thought content normal.        Judgment: Judgment normal.    Results for orders placed or performed in visit on 12/14/20  Urine Culture   Specimen: Urine   UR  Result Value Ref Range   Urine Culture, Routine Final report    Organism ID, Bacteria Comment   Microscopic Examination   Urine  Result Value Ref Range   WBC, UA 11-30 (A) 0 - 5 /hpf   RBC 3-10 (A) 0 - 2 /hpf   Epithelial Cells (non renal) 0-10 0 - 10 /hpf   Mucus, UA Present (A) Not  Estab.   Bacteria, UA Moderate (A) None seen/Few  Urinalysis, Routine w reflex microscopic  Result Value Ref Range   Specific Gravity, UA 1.025 1.005 - 1.030   pH, UA 6.0 5.0 - 7.5   Color, UA Yellow Yellow   Appearance Ur Cloudy (A) Clear   Leukocytes,UA 3+ (A) Negative   Protein,UA 2+ (A) Negative/Trace   Glucose, UA Negative Negative   Ketones, UA Negative Negative   RBC, UA 2+ (A) Negative   Bilirubin, UA Negative Negative   Urobilinogen, Ur 0.2 0.2 - 1.0 mg/dL   Nitrite, UA Positive (A) Negative   Microscopic Examination See below:       Assessment & Plan:   Problem List Items Addressed This Visit       Cardiovascular and Mediastinum   Migraine - Primary    Ongoing. Has had a migraine for the last week. Will give Toradol in office today. Letter written for work. Maxalt given for abortive migraine medication treatment. Discussed how to properly use medication. Recommend blue light blocking glasses due to looking at computer screen daily.  Follow up if symptoms worsen or fail to improve.       Relevant Medications   rizatriptan (MAXALT) 10 MG tablet     Follow up plan: Return if symptoms worsen or fail to improve.

## 2022-03-08 ENCOUNTER — Ambulatory Visit: Payer: No Typology Code available for payment source | Admitting: Nurse Practitioner

## 2022-03-08 ENCOUNTER — Encounter: Payer: Self-pay | Admitting: Nurse Practitioner

## 2022-03-08 VITALS — BP 131/89 | HR 80 | Temp 98.3°F | Ht 71.0 in | Wt >= 6400 oz

## 2022-03-08 DIAGNOSIS — F339 Major depressive disorder, recurrent, unspecified: Secondary | ICD-10-CM

## 2022-03-08 DIAGNOSIS — F411 Generalized anxiety disorder: Secondary | ICD-10-CM | POA: Diagnosis not present

## 2022-03-08 MED ORDER — FLUOXETINE HCL 10 MG PO TABS: 10.0000 mg | ORAL_TABLET | Freq: Every day | ORAL | 0 refills | Status: DC

## 2022-03-08 NOTE — Progress Notes (Unsigned)
BP 131/89   Pulse 80   Temp 98.3 F (36.8 C) (Oral)   Ht 5\' 11"  (1.803 m)   Wt (!) 434 lb 3.2 oz (197 kg)   SpO2 100%   BMI 60.56 kg/m    Subjective:    Patient ID: Ashley Olson, female    DOB: Jun 11, 1985, 37 y.o.   MRN: [id number]  HPI: Ashley Olson is a 37 y.o. female  Chief Complaint  Patient presents with   Anxiety    Patient says when she is working for home, she is fine. Patient says when she goes back into the office and the managers are hovering over her. It causes her anxiety attacks flare up such as headaches. Patient says she is scared to have the attacks while going into the office.    Depression   DEPRESSION/ANXIETY Patient says when she is working for home, she is fine. Patient says when she goes back into the office and the managers are hovering over her. It causes her anxiety attacks flare up such as headaches. Patient says she is scared to have the attacks while going into the office.  Patient has been on medication in the past.  She has had some anxiety attacks.    [place] Office Visit from 03/08/2022 in [place]  PHQ-9 Total Score 15         03/08/2022    3:56 PM 10/14/2019    8:25 AM 05/07/2018    9:35 AM 07/10/2017    4:46 PM  GAD 7 : Generalized Anxiety Score  Nervous, Anxious, on Edge 3 3 2 2   Control/stop worrying 3 3 0 2  Worry too much - different things 3 3 2 1   Trouble relaxing 2 1 1 3   Restless 2 3 1 2   Easily annoyed or irritable 3 3 1 3   Afraid - awful might happen 2 1 0 1  Total GAD 7 Score 18 17 7 14   Anxiety Difficulty Very difficult Somewhat difficult  Very difficult      Relevant past medical, surgical, family and social history reviewed and updated as indicated. Interim medical history since our last visit reviewed. Allergies and medications reviewed and updated.  Review of Systems  Per HPI unless specifically indicated above     Objective:    BP 131/89   Pulse 80   Temp 98.3 F  (36.8 C) (Oral)   Ht 5\' 11"  (1.803 m)   Wt (!) 434 lb 3.2 oz (197 kg)   SpO2 100%   BMI 60.56 kg/m   Wt Readings from Last 3 Encounters:  03/08/22 (!) 434 lb 3.2 oz (197 kg)  01/14/21 (!) 417 lb 3.2 oz (189.2 kg)  12/14/20 (!) 411 lb 3.2 oz (186.5 kg)    Physical Exam  Results for orders placed or performed in visit on 12/14/20  Urine Culture   Specimen: Urine   UR  Result Value Ref Range   Urine Culture, Routine Final report    Organism ID, Bacteria Comment   Microscopic Examination   Urine  Result Value Ref Range   WBC, UA 11-30 (A) 0 - 5 /hpf   RBC, Urine 3-10 (A) 0 - 2 /hpf   Epithelial Cells (non renal) 0-10 0 - 10 /hpf   Mucus, UA Present (A) Not Estab.   Bacteria, UA Moderate (A) None seen/Few  Urinalysis, Routine w reflex microscopic  Result Value Ref Range   Specific Gravity, UA 1.025 1.005 -  1.030   pH, UA 6.0 5.0 - 7.5   Color, UA Yellow Yellow   Appearance Ur Cloudy (A) Clear   Leukocytes,UA 3+ (A) Negative   Protein,UA 2+ (A) Negative/Trace   Glucose, UA Negative Negative   Ketones, UA Negative Negative   RBC, UA 2+ (A) Negative   Bilirubin, UA Negative Negative   Urobilinogen, Ur 0.2 0.2 - 1.0 mg/dL   Nitrite, UA Positive (A) Negative   Microscopic Examination See below:       Assessment & Plan:   Problem List Items Addressed This Visit   None    Follow up plan: No follow-ups on file.

## 2022-03-08 NOTE — Assessment & Plan Note (Signed)
Chronic. Not well controlled. Patient agrees to restart Prozac 10mg.  Side effects and benefits of medication discussed.  FMLA work from home accommodations will be requested for 3 months.  Follow up in 1 month for reevaluation.  Call sooner if concerns arise.  

## 2022-03-09 NOTE — Assessment & Plan Note (Signed)
Chronic. Not well controlled. Patient agrees to restart Prozac 10mg .  Side effects and benefits of medication discussed.  FMLA work from home accommodations will be requested for 3 months.  Follow up in 1 month for reevaluation.  Call sooner if concerns arise.

## 2022-03-14 ENCOUNTER — Encounter: Payer: Self-pay | Admitting: Nurse Practitioner

## 2022-04-02 ENCOUNTER — Other Ambulatory Visit: Payer: Self-pay | Admitting: Nurse Practitioner

## 2022-04-03 NOTE — Telephone Encounter (Signed)
Requested Prescriptions  Pending Prescriptions Disp Refills   FLUoxetine (PROZAC) 10 MG tablet [Pharmacy Med Name: FLUOXETINE HCL 10 MG TABLET] 90 tablet 1    Sig: TAKE 1 TABLET BY MOUTH EVERY DAY     Psychiatry:  Antidepressants - SSRI Passed - 04/02/2022 11:32 AM      Passed - Completed PHQ-2 or PHQ-9 in the last 360 days      Passed - Valid encounter within last 6 months    Recent Outpatient Visits           3 weeks ago Depression, recurrent ([place])   [place] Jon Billings, NP   1 year ago Other migraine with status migrainosus, not intractable   [place] Jon Billings, NP   1 year ago Frequency of urination   [place] Vigg, Avanti, MD   2 years ago Annual physical exam   [place], Jessica A, NP   3 years ago Screen for STD (sexually transmitted disease)   [place] Venita Lick, NP       Future Appointments             In 1 week Jon Billings, NP [employer], [place]

## 2022-04-11 ENCOUNTER — Ambulatory Visit: Payer: No Typology Code available for payment source | Admitting: Nurse Practitioner

## 2022-06-13 ENCOUNTER — Ambulatory Visit
Admission: RE | Admit: 2022-06-13 | Discharge: 2022-06-13 | Disposition: A | Discharge status: Home / Self Care | Payer: No Typology Code available for payment source | Attending: Internal Medicine | Admitting: Internal Medicine

## 2022-06-13 ENCOUNTER — Ambulatory Visit: Payer: No Typology Code available for payment source | Admitting: Internal Medicine

## 2022-06-13 ENCOUNTER — Encounter: Payer: Self-pay | Admitting: Internal Medicine

## 2022-06-13 ENCOUNTER — Ambulatory Visit
Admission: RE | Admit: 2022-06-13 | Discharge: 2022-06-13 | Disposition: A | Discharge status: Home / Self Care | Payer: No Typology Code available for payment source | Source: Ambulatory Visit | Attending: Internal Medicine | Admitting: Internal Medicine

## 2022-06-13 VITALS — BP 136/86 | HR 97 | Temp 96.8°F | Ht 71.5 in | Wt >= 6400 oz

## 2022-06-13 DIAGNOSIS — Z833 Family history of diabetes mellitus: Secondary | ICD-10-CM

## 2022-06-13 DIAGNOSIS — F419 Anxiety disorder, unspecified: Secondary | ICD-10-CM

## 2022-06-13 DIAGNOSIS — M25561 Pain in right knee: Secondary | ICD-10-CM | POA: Insufficient documentation

## 2022-06-13 DIAGNOSIS — Z136 Encounter for screening for cardiovascular disorders: Secondary | ICD-10-CM

## 2022-06-13 DIAGNOSIS — G8929 Other chronic pain: Secondary | ICD-10-CM | POA: Insufficient documentation

## 2022-06-13 DIAGNOSIS — F32A Depression, unspecified: Secondary | ICD-10-CM

## 2022-06-13 DIAGNOSIS — F99 Mental disorder, not otherwise specified: Secondary | ICD-10-CM

## 2022-06-13 DIAGNOSIS — R600 Localized edema: Secondary | ICD-10-CM

## 2022-06-13 DIAGNOSIS — Z1159 Encounter for screening for other viral diseases: Secondary | ICD-10-CM

## 2022-06-13 DIAGNOSIS — F5105 Insomnia due to other mental disorder: Secondary | ICD-10-CM | POA: Diagnosis not present

## 2022-06-13 DIAGNOSIS — M5442 Lumbago with sciatica, left side: Secondary | ICD-10-CM

## 2022-06-13 MED ORDER — TRAZODONE HCL 50 MG PO TABS: 25.0000 mg | ORAL_TABLET | Freq: Every evening | ORAL | 0 refills | Status: DC | PRN

## 2022-06-13 NOTE — Assessment & Plan Note (Signed)
Will trial trazodone Consider sleep study

## 2022-06-13 NOTE — Assessment & Plan Note (Signed)
Encourage diet and exercise for weight loss 

## 2022-06-13 NOTE — Patient Instructions (Signed)
Edema ? ?Edema is when you have too much fluid in your body or under your skin. Edema may make your legs, feet, and ankles swell. Swelling often happens in looser tissues, such as around your eyes. This is a common condition. It gets more common as you get older. ?There are many possible causes of edema. These include: ?Eating too much salt (sodium). ?Being on your feet or sitting for a long time. ?Certain medical conditions, such as: ?Pregnancy. ?Heart failure. ?Liver disease. ?Kidney disease. ?Cancer. ?Hot weather may make edema worse. Edema is usually painless. Your skin may look swollen or shiny. ?Follow these instructions at home: ?Medicines ?Take over-the-counter and prescription medicines only as told by your doctor. ?Your doctor may prescribe a medicine to help your body get rid of extra water (diuretic). Take this medicine if you are told to take it. ?Eating and drinking ?Eat a low-salt (low-sodium) diet as told by your doctor. Sometimes, eating less salt may reduce swelling. ?Depending on the cause of your swelling, you may need to limit how much fluid you drink (fluid restriction). ?General instructions ?Raise the injured area above the level of your heart while you are sitting or lying down. ?Do not sit still or stand for a long time. ?Do not wear tight clothes. Do not wear garters on your upper legs. ?Exercise your legs. This can help the swelling go down. ?Wear compression stockings as told by your doctor. It is important that these are the right size. These should be prescribed by your doctor to prevent possible injuries. ?If elastic bandages or wraps are recommended, use them as told by your doctor. ?Contact a doctor if: ?Treatment is not working. ?You have heart, liver, or kidney disease and have symptoms of edema. ?You have sudden and unexplained weight gain. ?Get help right away if: ?You have shortness of breath or chest pain. ?You cannot breathe when you lie down. ?You have pain, redness, or  warmth in the swollen areas. ?You have heart, liver, or kidney disease and get edema all of a sudden. ?You have a fever and your symptoms get worse all of a sudden. ?These symptoms may be an emergency. Get help right away. Call 911. ?Do not wait to see if the symptoms will go away. ?Do not drive yourself to the hospital. ?Summary ?Edema is when you have too much fluid in your body or under your skin. ?Edema may make your legs, feet, and ankles swell. Swelling often happens in looser tissues, such as around your eyes. ?Raise the injured area above the level of your heart while you are sitting or lying down. ?Follow your doctor's instructions about diet and how much fluid you can drink. ?This information is not intended to replace advice given to you by your health care provider. Make sure you discuss any questions you have with your health care provider. ?Document Revised: 09/27/2020 Document Reviewed: 09/27/2020 ?Elsevier Patient Education ? 2023 [employer] ? ?

## 2022-06-13 NOTE — Assessment & Plan Note (Signed)
Not medicated Support offered 

## 2022-06-13 NOTE — Assessment & Plan Note (Signed)
X-ray right knee today Encouraged weight loss as this can help reduce joint pain

## 2022-06-13 NOTE — Assessment & Plan Note (Signed)
Encourage weight loss and core strengthening as this can help reduce back pain Continue ibuprofen and heat pad OTC as needed

## 2022-06-13 NOTE — Progress Notes (Signed)
HPI  Patient presents to clinic today to establish care and for management of the conditions listed below.  Anxiety and Depression: Chronic, she is no longer taking Fluoxetine.  She is not currently seeing a therapist.  She denies SI/HI.  Insomnia: She has difficulty falling or staying asleep. She is unsure if she snores. She has been on Seroquel and Belsomra. She is not taking any medications for this at this time.  There is no sleep study on file.  Chronic Low Back Pain with Left-Sided Sciatica: She takes Ibuprofen and uses heating pad as needed with some relief of symptoms.  X-ray of the lumbar spine from 06/2016 reviewed.  She does not follow with orthopedics.  She also reports right knee pain.  She reports this is a chronic issue that seems to be consistently getting worse.  She reports the pain is worse with ambulation.  She does feel like her knee is going to give out on her at times.  Past Medical History:  Diagnosis Date   Anemia    Morbid obesity (HCC)    Sciatic pain     Current Outpatient Medications  Medication Sig Dispense Refill   FLUoxetine (PROZAC) 10 MG tablet TAKE 1 TABLET BY MOUTH EVERY DAY 90 tablet 1   No current facility-administered medications for this visit.    Allergies  Allergen Reactions   Amoxicillin Swelling    Family History  Problem Relation Age of Onset   Hypertension Mother    Diabetes Mother    Autism Daughter    Hypertension Maternal Grandmother    Hypertension Maternal Grandfather    Heart disease Maternal Grandfather    Diabetes Maternal Grandfather    Mental illness Neg Hx     Social History   Socioeconomic History   Marital status: Single    Spouse name: Not on file   Number of children: Not on file   Years of education: Not on file   Highest education level: Not on file  Occupational History   Not on file  Tobacco Use   Smoking status: Former    Types: Cigarettes    Quit date: 06/12/2017    Years since quitting: 5.0    Smokeless tobacco: Never   Tobacco comments:    a pack every 2 weeks  Vaping Use   Vaping Use: Former  Substance and Sexual Activity   Alcohol use: Yes    Comment: Occasionally   Drug use: Not Currently   Sexual activity: Yes    Birth control/protection: Condom  Other Topics Concern   Not on file  Social History Narrative   Not on file   Social Determinants of Health   Financial Resource Strain: Not on file  Food Insecurity: Not on file  Transportation Needs: Not on file  Physical Activity: Not on file  Stress: Not on file  Social Connections: Not on file  Intimate Partner Violence: Not on file    ROS:  Constitutional: Denies fever, malaise, fatigue, headache or abrupt weight changes.  HEENT: Denies eye pain, eye redness, ear pain, ringing in the ears, wax buildup, runny nose, nasal congestion, bloody nose, or sore throat. Respiratory: Denies difficulty breathing, shortness of breath, cough or sputum production.   Cardiovascular: Patient reports swelling in legs.  Denies chest pain, chest tightness, palpitations or swelling in the hands.  Gastrointestinal: Denies abdominal pain, bloating, constipation, diarrhea or blood in the stool.  GU: Denies frequency, urgency, pain with urination, blood in urine, odor or discharge. Musculoskeletal: Patient  reports chronic low back pain, right knee pain.  Denies decrease in range of motion, difficulty with gait, or joint swelling.  Skin: Denies redness, rashes, lesions or ulcercations.  Neurological: Patient reports insomnia.  Denies dizziness, difficulty with memory, difficulty with speech or problems with balance and coordination.  Psych: Patient has a history of anxiety and depression.  Denies SI/HI.  No other specific complaints in a complete review of systems (except as listed in HPI above).  PE:  BP 136/86 (BP Location: Right Wrist, Patient Position: Sitting, Cuff Size: Normal)   Pulse 97   Temp (!) 96.8 F (36 C) (Temporal)    Ht 5' 11.5" (1.816 m)   Wt (!) 442 lb (200.5 kg)   SpO2 97%   BMI 60.79 kg/m   Wt Readings from Last 3 Encounters:  03/08/22 (!) 434 lb 3.2 oz (197 kg)  01/14/21 (!) 417 lb 3.2 oz (189.2 kg)  12/14/20 (!) 411 lb 3.2 oz (186.5 kg)    General: Appears her stated age, obese, in NAD. HEENT: Head: normal shape and size; Eyes: sclera white, no icterus, conjunctiva pink, PERRLA and EOMs intact;  Cardiovascular: Normal rate and rhythm. S1,S2 noted.  No murmur, rubs or gallops noted. No JVD.  Trace nonpitting BLE edema. No carotid bruits noted. Pulmonary/Chest: Normal effort and positive vesicular breath sounds. No respiratory distress. No wheezes, rales or ronchi noted.  Musculoskeletal: Generalized pain with palpation of the right knee.  No difficulty with gait.  Neurological: Alert and oriented. Cranial nerves II-XII grossly intact. Coordination normal.  Psychiatric: Mood and affect normal. Behavior is normal. Judgment and thought content normal.     BMET    Component Value Date/Time   NA 137 10/14/2019 0931   NA 140 02/16/2012 0449   K 4.2 10/14/2019 0931   K 4.0 02/16/2012 0449   CL 104 10/14/2019 0931   CL 109 (H) 02/16/2012 0449   CO2 22 10/14/2019 0931   CO2 24 02/16/2012 0449   GLUCOSE 86 10/14/2019 0931   GLUCOSE 88 05/16/2019 1208   GLUCOSE 99 02/16/2012 0449   BUN 10 10/14/2019 0931   BUN 13 02/16/2012 0449   CREATININE 0.74 10/14/2019 0931   CREATININE 0.74 02/21/2012 0608   CALCIUM 8.9 10/14/2019 0931   CALCIUM 8.3 (L) 02/16/2012 0449   GFRNONAA 107 10/14/2019 0931   GFRNONAA >60 02/21/2012 0608   GFRAA 123 10/14/2019 0931   GFRAA >60 02/21/2012 0608    Lipid Panel     Component Value Date/Time   CHOL 130 10/14/2019 0931   TRIG 90 10/14/2019 0931   HDL 60 10/14/2019 0931   LDLCALC 53 10/14/2019 0931    CBC    Component Value Date/Time   WBC 8.1 10/14/2019 0931   WBC 6.1 05/16/2019 1208   RBC 4.65 10/14/2019 0931   RBC 4.66 05/16/2019 1208   HGB  12.7 10/14/2019 0931   HCT 38.8 10/14/2019 0931   PLT 309 10/14/2019 0931   MCV 83 10/14/2019 0931   MCV 77 (L) 02/17/2012 0450   MCH 27.3 10/14/2019 0931   MCH 26.4 05/16/2019 1208   MCHC 32.7 10/14/2019 0931   MCHC 31.2 05/16/2019 1208   RDW 14.6 10/14/2019 0931   RDW 16.9 (H) 02/17/2012 0450   LYMPHSABS 2.6 10/14/2019 0931   LYMPHSABS 1.8 02/17/2012 0450   MONOABS 0.4 05/16/2019 1208   MONOABS 0.9 02/17/2012 0450   EOSABS 0.3 10/14/2019 0931   EOSABS 0.2 02/17/2012 0450   BASOSABS 0.0 10/14/2019 0931  BASOSABS 0.1 02/17/2012 0450    Hgb A1C No results found for: "HGBA1C"   Assessment and Plan:  Peripheral Edema:  Encourage elevation and low-salt diet Encouraged weight loss as this can help reduce swelling C-Met today If kidney function is fine, consider HCTZ 12.5 mg daily as needed  RTC in 6 months for annual exam Nicki Reaper, NP

## 2022-06-14 LAB — HEMOGLOBIN A1C
Hgb A1c MFr Bld: 5.8 % of total Hgb — ABNORMAL HIGH (ref <5.7)
Mean Plasma Glucose: 120 mg/dL
eAG (mmol/L): 6.6 mmol/L

## 2022-06-14 LAB — COMPLETE METABOLIC PANEL WITH GFR
AG Ratio: 1.3 (calc) (ref 1.0–2.5)
ALT: 13 U/L (ref 6–29)
AST: 11 U/L (ref 10–30)
Albumin: 4 g/dL (ref 3.6–5.1)
Alkaline phosphatase (APISO): 78 U/L (ref 31–125)
BUN: 12 mg/dL (ref 7–25)
CO2: 25 mmol/L (ref 20–32)
Calcium: 9.2 mg/dL (ref 8.6–10.2)
Chloride: 108 mmol/L (ref 98–110)
Creat: 0.82 mg/dL (ref 0.50–0.97)
Globulin: 3 g/dL (calc) (ref 1.9–3.7)
Glucose, Bld: 84 mg/dL (ref 65–139)
Potassium: 4.5 mmol/L (ref 3.5–5.3)
Sodium: 140 mmol/L (ref 135–146)
Total Bilirubin: 0.3 mg/dL (ref 0.2–1.2)
Total Protein: 7 g/dL (ref 6.1–8.1)
eGFR: 95 mL/min/{1.73_m2} (ref >=60)

## 2022-06-14 LAB — CBC
HCT: 40.4 % (ref 35.0–45.0)
Hemoglobin: 12.5 g/dL (ref 11.7–15.5)
MCH: 25.5 pg — ABNORMAL LOW (ref 27.0–33.0)
MCHC: 30.9 g/dL — ABNORMAL LOW (ref 32.0–36.0)
MCV: 82.3 fL (ref 80.0–100.0)
MPV: 10.1 fL (ref 7.5–12.5)
Platelets: 336 10*3/uL (ref 140–400)
RBC: 4.91 10*6/uL (ref 3.80–5.10)
RDW: 14.7 % (ref 11.0–15.0)
WBC: 5.9 10*3/uL (ref 3.8–10.8)

## 2022-06-14 LAB — LIPID PANEL
Cholesterol: 120 mg/dL (ref <200)
HDL: 67 mg/dL (ref >=50)
LDL Cholesterol (Calc): 34 mg/dL (calc)
Non-HDL Cholesterol (Calc): 53 mg/dL (calc) (ref <130)
Total CHOL/HDL Ratio: 1.8 (calc) (ref <5.0)
Triglycerides: 100 mg/dL (ref <150)

## 2022-06-14 LAB — HEPATITIS C ANTIBODY: Hepatitis C Ab: NONREACTIVE

## 2022-12-14 ENCOUNTER — Ambulatory Visit (INDEPENDENT_AMBULATORY_CARE_PROVIDER_SITE_OTHER): Payer: No Typology Code available for payment source | Admitting: Internal Medicine

## 2022-12-14 ENCOUNTER — Other Ambulatory Visit (HOSPITAL_COMMUNITY)
Admission: RE | Admit: 2022-12-14 | Discharge: 2022-12-14 | Disposition: A | Discharge status: Home / Self Care | Payer: No Typology Code available for payment source | Source: Ambulatory Visit | Attending: Internal Medicine | Admitting: Internal Medicine

## 2022-12-14 ENCOUNTER — Encounter: Payer: Self-pay | Admitting: Internal Medicine

## 2022-12-14 VITALS — BP 132/78 | Ht 71.5 in | Wt >= 6400 oz

## 2022-12-14 DIAGNOSIS — Z113 Encounter for screening for infections with a predominantly sexual mode of transmission: Secondary | ICD-10-CM | POA: Diagnosis present

## 2022-12-14 DIAGNOSIS — Z0001 Encounter for general adult medical examination with abnormal findings: Secondary | ICD-10-CM

## 2022-12-14 DIAGNOSIS — R7303 Prediabetes: Secondary | ICD-10-CM | POA: Diagnosis not present

## 2022-12-14 DIAGNOSIS — Z136 Encounter for screening for cardiovascular disorders: Secondary | ICD-10-CM

## 2022-12-14 DIAGNOSIS — N898 Other specified noninflammatory disorders of vagina: Secondary | ICD-10-CM

## 2022-12-14 NOTE — Progress Notes (Signed)
Subjective:    Patient ID: Ashley Olson, female    DOB: Oct 09, 1985, 37 y.o.   MRN: [id number]  HPI  Patient presents to clinic today for her annual exam.  Flu: never Tetanus: 11/2016 COVID: never Pap smear: 10/2019 Dentist: biannually  Diet: She does eat meat. She consumes more fruits than veggies. She does eat some fried foods. She drinks mostly water. Exercise: None  Review of Systems     Past Medical History:  Diagnosis Date   Anemia    Morbid obesity (HCC)    Sciatic pain     Current Outpatient Medications  Medication Sig Dispense Refill   traZODone (DESYREL) 50 MG tablet Take 0.5-1 tablets (25-50 mg total) by mouth at bedtime as needed for sleep. 90 tablet 0   No current facility-administered medications for this visit.    Allergies  Allergen Reactions   Amoxicillin Swelling    Family History  Problem Relation Age of Onset   Hypertension Mother    Diabetes Mother    Hypertension Maternal Grandmother    Hypertension Maternal Grandfather    Heart disease Maternal Grandfather    Diabetes Maternal Grandfather    Autism Daughter    Mental illness Neg Hx     Social History   Socioeconomic History   Marital status: Single    Spouse name: Not on file   Number of children: Not on file   Years of education: Not on file   Highest education level: Not on file  Occupational History   Not on file  Tobacco Use   Smoking status: Former    Current packs/day: 0.00    Types: Cigarettes    Quit date: 06/12/2017    Years since quitting: 5.5   Smokeless tobacco: Never   Tobacco comments:    a pack every 2 weeks  Vaping Use   Vaping status: Former  Substance and Sexual Activity   Alcohol use: Yes    Comment: Occasionally   Drug use: Not Currently   Sexual activity: Yes    Birth control/protection: Condom  Other Topics Concern   Not on file  Social History Narrative   Not on file   Social Determinants of Health   Financial Resource Strain: Not on  file  Food Insecurity: Not on file  Transportation Needs: Not on file  Physical Activity: Not on file  Stress: Not on file  Social Connections: Not on file  Intimate Partner Violence: Not on file     Constitutional: Pt reports headaches. Denies fever, malaise, fatigue, or abrupt weight changes.  HEENT: Pt reports blurred vision. Denies eye pain, eye redness, ear pain, ringing in the ears, wax buildup, runny nose, nasal congestion, bloody nose, or sore throat. Respiratory: Denies difficulty breathing, shortness of breath, cough or sputum production.   Cardiovascular: Denies chest pain, chest tightness, palpitations or swelling in the hands or feet.  Gastrointestinal: Pt reports constipation. Denies abdominal pain, bloating, diarrhea or blood in the stool.  GU: Pt reports vaginal odor. Denies urgency, frequency, pain with urination, burning sensation, blood in urine, or discharge. Musculoskeletal: Patient reports chronic joint pain.  Denies decrease in range of motion, difficulty with gait, muscle pain or joint swelling.  Skin: Denies redness, rashes, lesions or ulcercations.  Neurological: Patient reports insomnia.  Denies dizziness, difficulty with memory, difficulty with speech or problems with balance and coordination.  Psych: Patient has a history of anxiety and depression.  Denies SI/HI.  No other specific complaints in a complete review  of systems (except as listed in HPI above).  Objective:   Physical Exam  BP 132/78   Ht 5' 11.5" (1.816 m)   Wt (!) 446 lb (202.3 kg)   BMI 61.34 kg/m   Wt Readings from Last 3 Encounters:  06/13/22 (!) 442 lb (200.5 kg)  03/08/22 (!) 434 lb 3.2 oz (197 kg)  01/14/21 (!) 417 lb 3.2 oz (189.2 kg)    General: Appears her stated age, obese, in NAD. Skin: Warm, dry and intact.  HEENT: Head: normal shape and size; Eyes: sclera white, no icterus, conjunctiva pink, PERRLA and EOMs intact;  Neck:  Neck supple, trachea midline. No masses, lumps or  thyromegaly present.  Cardiovascular: Normal rate and rhythm. S1,S2 noted.  No murmur, rubs or gallops noted. No JVD or BLE edema. Pulmonary/Chest: Normal effort and positive vesicular breath sounds. No respiratory distress. No wheezes, rales or ronchi noted.  Abdomen: Soft and nontender. Normal bowel sounds. Musculoskeletal: Strength 5/5 BUE/BLE. No difficulty with gait.  Neurological: Alert and oriented. Cranial nerves II-XII grossly intact. Coordination normal.  Psychiatric: Mood and affect normal. Behavior is normal. Judgment and thought content normal.    BMET    Component Value Date/Time   NA 140 06/13/2022 1046   NA 137 10/14/2019 0931   NA 140 02/16/2012 0449   K 4.5 06/13/2022 1046   K 4.0 02/16/2012 0449   CL 108 06/13/2022 1046   CL 109 (H) 02/16/2012 0449   CO2 25 06/13/2022 1046   CO2 24 02/16/2012 0449   GLUCOSE 84 06/13/2022 1046   GLUCOSE 99 02/16/2012 0449   BUN 12 06/13/2022 1046   BUN 10 10/14/2019 0931   BUN 13 02/16/2012 0449   CREATININE 0.82 06/13/2022 1046   CALCIUM 9.2 06/13/2022 1046   CALCIUM 8.3 (L) 02/16/2012 0449   GFRNONAA 107 10/14/2019 0931   GFRNONAA >60 02/21/2012 0608   GFRAA 123 10/14/2019 0931   GFRAA >60 02/21/2012 0608    Lipid Panel     Component Value Date/Time   CHOL 120 06/13/2022 1046   CHOL 130 10/14/2019 0931   TRIG 100 06/13/2022 1046   HDL 67 06/13/2022 1046   HDL 60 10/14/2019 0931   CHOLHDL 1.8 06/13/2022 1046   LDLCALC 34 06/13/2022 1046    CBC    Component Value Date/Time   WBC 5.9 06/13/2022 1046   RBC 4.91 06/13/2022 1046   HGB 12.5 06/13/2022 1046   HGB 12.7 10/14/2019 0931   HCT 40.4 06/13/2022 1046   HCT 38.8 10/14/2019 0931   PLT 336 06/13/2022 1046   PLT 309 10/14/2019 0931   MCV 82.3 06/13/2022 1046   MCV 83 10/14/2019 0931   MCV 77 (L) 02/17/2012 0450   MCH 25.5 (L) 06/13/2022 1046   MCHC 30.9 (L) 06/13/2022 1046   RDW 14.7 06/13/2022 1046   RDW 14.6 10/14/2019 0931   RDW 16.9 (H) 02/17/2012  0450   LYMPHSABS 2.6 10/14/2019 0931   LYMPHSABS 1.8 02/17/2012 0450   MONOABS 0.4 05/16/2019 1208   MONOABS 0.9 02/17/2012 0450   EOSABS 0.3 10/14/2019 0931   EOSABS 0.2 02/17/2012 0450   BASOSABS 0.0 10/14/2019 0931   BASOSABS 0.1 02/17/2012 0450    Hgb A1C Lab Results  Component Value Date   HGBA1C 5.8 (H) 06/13/2022           Assessment & Plan:   Preventative health maintenance:  Flu shot declined Tetanus UTD Encouraged her to get her COVID booster Pap smear UTD Encouraged  her to consume a balanced diet and exercise regimen Advised her to see an eye doctor and dentist annually We will check CBC, c-Met, lipid, A1c today  Vaginal odor:  Will check wet prep for gonorrhea, chlamydia, yeast, BV and trichomonas  RTC in 6 months, follow-up chronic conditions Nicki Reaper, NP

## 2022-12-14 NOTE — Patient Instructions (Signed)

## 2022-12-15 LAB — CBC
HCT: 37.9 % (ref 35.0–45.0)
Hemoglobin: 12.1 g/dL (ref 11.7–15.5)
MCH: 25.9 pg — ABNORMAL LOW (ref 27.0–33.0)
MCHC: 31.9 g/dL — ABNORMAL LOW (ref 32.0–36.0)
MCV: 81.2 fL (ref 80.0–100.0)
MPV: 10.3 fL (ref 7.5–12.5)
Platelets: 345 10*3/uL (ref 140–400)
RBC: 4.67 10*6/uL (ref 3.80–5.10)
RDW: 14.7 % (ref 11.0–15.0)
WBC: 7 10*3/uL (ref 3.8–10.8)

## 2022-12-15 LAB — SYPHILIS: RPR W/REFLEX TO RPR TITER AND TREPONEMAL ANTIBODIES, TRADITIONAL SCREENING AND DIAGNOSIS ALGORITHM: RPR Ser Ql: NONREACTIVE

## 2022-12-15 LAB — COMPLETE METABOLIC PANEL WITH GFR
AG Ratio: 1.3 (calc) (ref 1.0–2.5)
ALT: 13 U/L (ref 6–29)
AST: 12 U/L (ref 10–30)
Albumin: 3.8 g/dL (ref 3.6–5.1)
Alkaline phosphatase (APISO): 81 U/L (ref 31–125)
BUN: 11 mg/dL (ref 7–25)
CO2: 26 mmol/L (ref 20–32)
Calcium: 8.7 mg/dL (ref 8.6–10.2)
Chloride: 106 mmol/L (ref 98–110)
Creat: 0.81 mg/dL (ref 0.50–0.97)
Globulin: 3 g/dL (ref 1.9–3.7)
Glucose, Bld: 106 mg/dL — ABNORMAL HIGH (ref 65–99)
Potassium: 4.3 mmol/L (ref 3.5–5.3)
Sodium: 138 mmol/L (ref 135–146)
Total Bilirubin: 0.3 mg/dL (ref 0.2–1.2)
Total Protein: 6.8 g/dL (ref 6.1–8.1)
eGFR: 96 mL/min/{1.73_m2} (ref >=60)

## 2022-12-15 LAB — LIPID PANEL
Cholesterol: 117 mg/dL
HDL: 60 mg/dL
LDL Cholesterol (Calc): 39 mg/dL
Non-HDL Cholesterol (Calc): 57 mg/dL
Total CHOL/HDL Ratio: 2 (calc)
Triglycerides: 92 mg/dL

## 2022-12-15 LAB — HEMOGLOBIN A1C
Hgb A1c MFr Bld: 5.8 %{Hb} — ABNORMAL HIGH (ref <5.7)
Mean Plasma Glucose: 120 mg/dL
eAG (mmol/L): 6.6 mmol/L

## 2022-12-15 LAB — HIV ANTIBODY (ROUTINE TESTING W REFLEX): HIV 1&2 Ab, 4th Generation: NONREACTIVE

## 2022-12-15 LAB — HEPATITIS C ANTIBODY: Hepatitis C Ab: NONREACTIVE

## 2022-12-15 NOTE — Assessment & Plan Note (Signed)
Encouraged diet and exercise for weight loss ?

## 2022-12-18 LAB — CERVICOVAGINAL ANCILLARY ONLY
Bacterial Vaginitis (gardnerella): NEGATIVE
Candida Glabrata: NEGATIVE
Candida Vaginitis: NEGATIVE
Chlamydia: NEGATIVE
Comment: NEGATIVE
Comment: NEGATIVE
Comment: NEGATIVE
Comment: NEGATIVE
Comment: NEGATIVE
Comment: NORMAL
Neisseria Gonorrhea: NEGATIVE
Trichomonas: NEGATIVE

## 2023-06-15 ENCOUNTER — Ambulatory Visit (INDEPENDENT_AMBULATORY_CARE_PROVIDER_SITE_OTHER): Payer: Self-pay | Admitting: Internal Medicine

## 2023-06-15 ENCOUNTER — Encounter: Payer: Self-pay | Admitting: Internal Medicine

## 2023-06-15 VITALS — BP 142/87 | Ht 71.5 in | Wt >= 6400 oz

## 2023-06-15 DIAGNOSIS — M5442 Lumbago with sciatica, left side: Secondary | ICD-10-CM

## 2023-06-15 DIAGNOSIS — F99 Mental disorder, not otherwise specified: Secondary | ICD-10-CM

## 2023-06-15 DIAGNOSIS — R030 Elevated blood-pressure reading, without diagnosis of hypertension: Secondary | ICD-10-CM

## 2023-06-15 DIAGNOSIS — Z975 Presence of (intrauterine) contraceptive device: Secondary | ICD-10-CM

## 2023-06-15 DIAGNOSIS — M25561 Pain in right knee: Secondary | ICD-10-CM

## 2023-06-15 DIAGNOSIS — M25562 Pain in left knee: Secondary | ICD-10-CM

## 2023-06-15 DIAGNOSIS — R7303 Prediabetes: Secondary | ICD-10-CM | POA: Diagnosis not present

## 2023-06-15 DIAGNOSIS — F32A Depression, unspecified: Secondary | ICD-10-CM

## 2023-06-15 DIAGNOSIS — F419 Anxiety disorder, unspecified: Secondary | ICD-10-CM

## 2023-06-15 DIAGNOSIS — G8929 Other chronic pain: Secondary | ICD-10-CM

## 2023-06-15 DIAGNOSIS — F5105 Insomnia due to other mental disorder: Secondary | ICD-10-CM

## 2023-06-15 MED ORDER — FUROSEMIDE 20 MG PO TABS: ORAL_TABLET | ORAL | 0 refills | Status: DC

## 2023-06-15 NOTE — Assessment & Plan Note (Signed)
Not medicated Support offered 

## 2023-06-15 NOTE — Assessment & Plan Note (Signed)
 Encouraged diet and exercise for weight loss ?

## 2023-06-15 NOTE — Patient Instructions (Signed)

## 2023-06-15 NOTE — Assessment & Plan Note (Signed)
 Will check A1c in 2 weeks with additional labs Encourage low-carb diet and exercise for weight loss

## 2023-06-15 NOTE — Progress Notes (Signed)
 HPI  Patient presents to clinic today for follow-up of chronic conditions.  Anxiety and depression: Chronic, however she is not currently taking any medications for this but has been on fluoxetine  in the past.  She is not currently seeing a therapist.  She denies SI/HI.  Insomnia: She has difficulty falling or staying asleep. She has been on seroquel  and belsomra  in the past. She is not taking any medications for this at this time.  There is no sleep study on file.  Chronic low back pain with left-sided sciatica: She takes Ibuprofen  and uses heating pad as needed with some relief of symptoms.  X-ray of the lumbar spine from 06/2016 reviewed.  She does not follow with orthopedics.  Chronic knee pain.  She is not currently taking medications for this. She is taking ibuprofen  as needed.  She is not currently following with orthopedics.  Prediabetes: Her last A1c was 5.8%, 12/2022.  She is not taking any oral diabetic medication at this time.  She does not check her sugars.  Of note, her BP today is 149/89. She is not taking any hypertensive medication at this time.   Past Medical History:  Diagnosis Date   Anemia    Morbid obesity (HCC)    Sciatic pain     Current Outpatient Medications  Medication Sig Dispense Refill   Etonogestrel  (NEXPLANON  Beemer) Inject into the skin.     No current facility-administered medications for this visit.    Allergies  Allergen Reactions   Amoxicillin Swelling    Family History  Problem Relation Age of Onset   Hypertension Mother    Diabetes Mother    Hypertension Maternal Grandmother    Hypertension Maternal Grandfather    Heart disease Maternal Grandfather    Diabetes Maternal Grandfather    Autism Daughter    Mental illness Neg Hx     Social History   Socioeconomic History   Marital status: Single    Spouse name: Not on file   Number of children: Not on file   Years of education: Not on file   Highest education level: Not on file   Occupational History   Not on file  Tobacco Use   Smoking status: Former    Current packs/day: 0.00    Types: Cigarettes    Quit date: 06/12/2017    Years since quitting: 6.0   Smokeless tobacco: Never   Tobacco comments:    a pack every 2 weeks  Vaping Use   Vaping status: Former  Substance and Sexual Activity   Alcohol use: Yes    Comment: Occasionally   Drug use: Not Currently   Sexual activity: Yes    Birth control/protection: Condom  Other Topics Concern   Not on file  Social History Narrative   Not on file   Social Drivers of Health   Financial Resource Strain: Not on file  Food Insecurity: Not on file  Transportation Needs: Not on file  Physical Activity: Not on file  Stress: Not on file  Social Connections: Not on file  Intimate Partner Violence: Not on file    ROS:  Constitutional: Denies fever, malaise, fatigue, headache or abrupt weight changes.  HEENT: Denies eye pain, eye redness, ear pain, ringing in the ears, wax buildup, runny nose, nasal congestion, bloody nose, or sore throat. Respiratory: Denies difficulty breathing, shortness of breath, cough or sputum production.   Cardiovascular: Patient reports swelling in legs.  Denies chest pain, chest tightness, palpitations or swelling in the hands.  Gastrointestinal: Denies abdominal pain, bloating, constipation, diarrhea or blood in the stool.  GU: Denies frequency, urgency, pain with urination, blood in urine, odor or discharge. Musculoskeletal: Patient reports chronic low back pain, right knee pain.  Denies decrease in range of motion, difficulty with gait, or joint swelling.  Skin: Denies redness, rashes, lesions or ulcercations.  Neurological: Patient reports insomnia, lightheadedness.  Denies difficulty with memory, difficulty with speech or problems with balance and coordination.  Psych: Patient has a history of anxiety and depression.  Denies SI/HI.  No other specific complaints in a complete review  of systems (except as listed in HPI above).  PE:  BP (!) 142/87   Ht 5' 11.5" (1.816 m)   Wt (!) 455 lb 8 oz (206.6 kg)   BMI 62.64 kg/m    Wt Readings from Last 3 Encounters:  12/14/22 (!) 446 lb (202.3 kg)  06/13/22 (!) 442 lb (200.5 kg)  03/08/22 (!) 434 lb 3.2 oz (197 kg)    General: Appears her stated age, obese, in NAD. Cardiovascular: Normal rate and rhythm. S1,S2 noted.  No murmur, rubs or gallops noted. No JVD.  1+ pitting BLE edema. No carotid bruits noted. Pulmonary/Chest: Normal effort and positive vesicular breath sounds. No respiratory distress. No wheezes, rales or ronchi noted.  Musculoskeletal: Joint enlargement noted of bilateral knees. No difficulty with gait.  Neurological: Alert and oriented. Cranial nerves II-XII grossly intact. Coordination normal.  Psychiatric: Mood and affect normal. Behavior is normal. Judgment and thought content normal.     BMET    Component Value Date/Time   NA 138 12/14/2022 1540   NA 137 10/14/2019 0931   NA 140 02/16/2012 0449   K 4.3 12/14/2022 1540   K 4.0 02/16/2012 0449   CL 106 12/14/2022 1540   CL 109 (H) 02/16/2012 0449   CO2 26 12/14/2022 1540   CO2 24 02/16/2012 0449   GLUCOSE 106 (H) 12/14/2022 1540   GLUCOSE 99 02/16/2012 0449   BUN 11 12/14/2022 1540   BUN 10 10/14/2019 0931   BUN 13 02/16/2012 0449   CREATININE 0.81 12/14/2022 1540   CALCIUM 8.7 12/14/2022 1540   CALCIUM 8.3 (L) 02/16/2012 0449   GFRNONAA 107 10/14/2019 0931   GFRNONAA >60 02/21/2012 0608   GFRAA 123 10/14/2019 0931   GFRAA >60 02/21/2012 0608    Lipid Panel     Component Value Date/Time   CHOL 117 12/14/2022 1540   CHOL 130 10/14/2019 0931   TRIG 92 12/14/2022 1540   HDL 60 12/14/2022 1540   HDL 60 10/14/2019 0931   CHOLHDL 2.0 12/14/2022 1540   LDLCALC 39 12/14/2022 1540    CBC    Component Value Date/Time   WBC 7.0 12/14/2022 1540   RBC 4.67 12/14/2022 1540   HGB 12.1 12/14/2022 1540   HGB 12.7 10/14/2019 0931   HCT  37.9 12/14/2022 1540   HCT 38.8 10/14/2019 0931   PLT 345 12/14/2022 1540   PLT 309 10/14/2019 0931   MCV 81.2 12/14/2022 1540   MCV 83 10/14/2019 0931   MCV 77 (L) 02/17/2012 0450   MCH 25.9 (L) 12/14/2022 1540   MCHC 31.9 (L) 12/14/2022 1540   RDW 14.7 12/14/2022 1540   RDW 14.6 10/14/2019 0931   RDW 16.9 (H) 02/17/2012 0450   LYMPHSABS 2.6 10/14/2019 0931   LYMPHSABS 1.8 02/17/2012 0450   MONOABS 0.4 05/16/2019 1208   MONOABS 0.9 02/17/2012 0450   EOSABS 0.3 10/14/2019 0931   EOSABS 0.2 02/17/2012 0450  BASOSABS 0.0 10/14/2019 0931   BASOSABS 0.1 02/17/2012 0450    Hgb A1C Lab Results  Component Value Date   HGBA1C 5.8 (H) 12/14/2022     Assessment and Plan:  Elevated blood pressure in office without diagnosis of hypertension, peripheral edema  Will start furosemide 20 mg daily but take twice daily for the first 3 days Advised her if she starts having any muscle cramping to eat green leafy vegetables or banana Will plan to repeat her blood work in 2 weeks to check kidney function and potassium If BP remains elevated, will need to discuss antihypertensive medication Reinforced DASH diet and exercise for weight loss  RTC in 2 weeks for followup HTN, edema, 6 months for your annual exam Helayne Lo, NP

## 2023-06-15 NOTE — Assessment & Plan Note (Signed)
 Not medicated Will consider sleep study

## 2023-06-15 NOTE — Assessment & Plan Note (Signed)
Encourage weight loss and core strengthening as this can help reduce back pain Continue ibuprofen and heat pad OTC as needed

## 2023-06-15 NOTE — Assessment & Plan Note (Signed)
 Continue ibuprofen  OTC as needed Encouraged weight loss as this can help reduce joint pain

## 2023-07-05 ENCOUNTER — Ambulatory Visit (INDEPENDENT_AMBULATORY_CARE_PROVIDER_SITE_OTHER): Payer: Self-pay | Admitting: Internal Medicine

## 2023-07-05 ENCOUNTER — Encounter: Payer: Self-pay | Admitting: Internal Medicine

## 2023-07-05 VITALS — BP 157/89 | HR 95 | Ht 71.5 in | Wt >= 6400 oz

## 2023-07-05 DIAGNOSIS — R7303 Prediabetes: Secondary | ICD-10-CM

## 2023-07-05 DIAGNOSIS — Z136 Encounter for screening for cardiovascular disorders: Secondary | ICD-10-CM | POA: Diagnosis not present

## 2023-07-05 DIAGNOSIS — I10 Essential (primary) hypertension: Secondary | ICD-10-CM | POA: Diagnosis not present

## 2023-07-05 DIAGNOSIS — M25562 Pain in left knee: Secondary | ICD-10-CM

## 2023-07-05 DIAGNOSIS — R600 Localized edema: Secondary | ICD-10-CM

## 2023-07-05 MED ORDER — FUROSEMIDE 20 MG PO TABS: 20.0000 mg | ORAL_TABLET | Freq: Every day | ORAL | 0 refills | Status: AC

## 2023-07-05 NOTE — Patient Instructions (Signed)
 RICE Therapy for Routine Care of Injuries Many injuries can be cared for with rest, ice, compression, and elevation. This is also called RICE therapy. RICE therapy includes: Resting the injured body part. Putting ice on the injury. Putting pressure on the injury. This is also called compression. Raising the injured part. This is also called elevation. RICE therapy can help reduce pain and swelling. Supplies needed: Ice. Plastic bag. Towel. Elastic bandage. Pillow or pillows to raise the injured body part. How to care for your injury with RICE therapy Rest Try to rest the injured part of your body. You can go back to your normal activities when your health care provider says it's okay to do them and when you can do them without pain. Ask what things are safe for you to do. Some injuries heal better with early movement instead of resting. If you rest the injury too much, it may not heal as well. Ask your provider if you should do exercises to help your injury get better. Ice Putting ice on your injury can help to lessen swelling and pain. Do not apply ice directly to your skin. Use ice on as many days as told by your provider. If told, put ice on the area. Put ice in a plastic bag. Place a towel between your skin and the bag. Leave the ice on for 20 minutes, 2-3 times a day. If your skin turns bright red, take off the ice right away to prevent skin damage. The risk of damage is higher if you can't feel pain, heat, or cold.  Compression Put pressure, also called compression, on your injured area. This can be done with an elastic bandage. If this type of bandage has been put on your injury: Follow instructions on the package the bandage came in about how to use it. Do not wrap the bandage too tightly. Wrap the bandage more loosely if part of your body beyond the bandage looks blue, or is swollen, cold, painful, or loses feeling. Take off the bandage and put it on again every 3-4 hours or  as told by your provider. Call your provider if the bandage seems to make your injury worse.  Elevation Raise the injured area above the level of your heart while you're sitting or lying down. Use a pillow to support your injured area as needed. Follow these instructions at home: If your symptoms get worse or last a long time, make a follow-up appointment with your provider. You may need to have imaging tests, such as X-rays or an MRI. If you have imaging tests, ask how to get your results when they are ready. Contact a health care provider if: You keep having pain and swelling. Your symptoms get worse. Get help right away if: You have sudden, very bad pain at your injury or lower than your injury. You have tingling or numbness at your injury or lower than your injury, and it does not go away when you take the bandage off. This information is not intended to replace advice given to you by your health care provider. Make sure you discuss any questions you have with your health care provider. Document Revised: 10/26/2022 Document Reviewed: 04/10/2022 Elsevier Patient Education  2024 [employer].

## 2023-07-05 NOTE — Progress Notes (Signed)
 HPI  Patient presents to clinic today for follow-up of elevated blood sugar and edema.  She was recently started on furosemide .  She has been taking the medication as prescribed.  Her BP today is 160/97.  The swelling has improved per her report.  ECG from 06/2017 reviewed.  Of note, she also reports left knee pain for the last 5 days.  She reports she was walking around a lot on Sunday and then suddenly felt like her knee was swollen.  She denies any specific injury to the area.  She has not tried anything OTC for this.  Past Medical History:  Diagnosis Date   Anemia    Morbid obesity (HCC)    Sciatic pain     Current Outpatient Medications  Medication Sig Dispense Refill   Etonogestrel  (NEXPLANON  [place]) Inject into the skin.     furosemide  (LASIX ) 20 MG tablet Take 1 tab PO bid x 3 days then daily in am thereafter 30 tablet 0   No current facility-administered medications for this visit.    Allergies  Allergen Reactions   Amoxicillin Swelling    Family History  Problem Relation Age of Onset   Hypertension Mother    Diabetes Mother    Hypertension Maternal Grandmother    Hypertension Maternal Grandfather    Heart disease Maternal Grandfather    Diabetes Maternal Grandfather    Autism Daughter    Mental illness Neg Hx     Social History   Socioeconomic History   Marital status: Single    Spouse name: Not on file   Number of children: Not on file   Years of education: Not on file   Highest education level: Not on file  Occupational History   Not on file  Tobacco Use   Smoking status: Former    Current packs/day: 0.00    Types: Cigarettes    Quit date: 06/12/2017    Years since quitting: 6.0   Smokeless tobacco: Never   Tobacco comments:    a pack every 2 weeks  Vaping Use   Vaping status: Former  Substance and Sexual Activity   Alcohol use: Yes    Comment: Occasionally   Drug use: Not Currently   Sexual activity: Yes    Birth control/protection: Condom  Other  Topics Concern   Not on file  Social History Narrative   Not on file   Social Drivers of Health   Financial Resource Strain: Not on file  Food Insecurity: Not on file  Transportation Needs: Not on file  Physical Activity: Not on file  Stress: Not on file  Social Connections: Not on file  Intimate Partner Violence: Not on file    ROS:  Constitutional: Denies fever, malaise, fatigue, headache or abrupt weight changes.  Respiratory: Denies difficulty breathing, shortness of breath, cough or sputum production.   Cardiovascular: Patient reports intermittent swelling in legs.  Denies chest pain, chest tightness, palpitations or swelling in the hands.  Musculoskeletal: Patient reports chronic low back pain, left knee pain.  Denies decrease in range of motion, difficulty with gait, or joint swelling.  Skin: Denies redness, rashes, lesions or ulcercations.  Neurological: Patient reports insomnia, lightheadedness.  Denies difficulty with memory, difficulty with speech or problems with balance and coordination.    No other specific complaints in a complete review of systems (except as listed in HPI above).  PE:  BP (!) 160/97 (BP Location: Left Wrist, Patient Position: Sitting, Cuff Size: Normal)   Pulse 95  Ht 5' 11.5" (1.816 m)   Wt (!) 457 lb 5 oz (207.4 kg)   LMP 06/11/2023 (Approximate)   BMI 62.89 kg/m     Wt Readings from Last 3 Encounters:  06/15/23 (!) 455 lb 8 oz (206.6 kg)  12/14/22 (!) 446 lb (202.3 kg)  06/13/22 (!) 442 lb (200.5 kg)    General: Appears her stated age, obese, in NAD. Cardiovascular: Normal rate and rhythm. S1,S2 noted.  No murmur, rubs or gallops noted. No JVD.  Trace nonpitting BLE edema. No carotid bruits noted. Pulmonary/Chest: Normal effort and positive vesicular breath sounds. No respiratory distress. No wheezes, rales or ronchi noted.  Musculoskeletal: Joint enlargement noted of bilateral knees.  Pain with palpation superior to the left  patella.  No difficulty with gait.  Neurological: Alert and oriented. Coordination normal.    BMET    Component Value Date/Time   NA 138 12/14/2022 1540   NA 137 10/14/2019 0931   NA 140 02/16/2012 0449   K 4.3 12/14/2022 1540   K 4.0 02/16/2012 0449   CL 106 12/14/2022 1540   CL 109 (H) 02/16/2012 0449   CO2 26 12/14/2022 1540   CO2 24 02/16/2012 0449   GLUCOSE 106 (H) 12/14/2022 1540   GLUCOSE 99 02/16/2012 0449   BUN 11 12/14/2022 1540   BUN 10 10/14/2019 0931   BUN 13 02/16/2012 0449   CREATININE 0.81 12/14/2022 1540   CALCIUM 8.7 12/14/2022 1540   CALCIUM 8.3 (L) 02/16/2012 0449   GFRNONAA 107 10/14/2019 0931   GFRNONAA >60 02/21/2012 0608   GFRAA 123 10/14/2019 0931   GFRAA >60 02/21/2012 0608    Lipid Panel     Component Value Date/Time   CHOL 117 12/14/2022 1540   CHOL 130 10/14/2019 0931   TRIG 92 12/14/2022 1540   HDL 60 12/14/2022 1540   HDL 60 10/14/2019 0931   CHOLHDL 2.0 12/14/2022 1540   LDLCALC 39 12/14/2022 1540    CBC    Component Value Date/Time   WBC 7.0 12/14/2022 1540   RBC 4.67 12/14/2022 1540   HGB 12.1 12/14/2022 1540   HGB 12.7 10/14/2019 0931   HCT 37.9 12/14/2022 1540   HCT 38.8 10/14/2019 0931   PLT 345 12/14/2022 1540   PLT 309 10/14/2019 0931   MCV 81.2 12/14/2022 1540   MCV 83 10/14/2019 0931   MCV 77 (L) 02/17/2012 0450   MCH 25.9 (L) 12/14/2022 1540   MCHC 31.9 (L) 12/14/2022 1540   RDW 14.7 12/14/2022 1540   RDW 14.6 10/14/2019 0931   RDW 16.9 (H) 02/17/2012 0450   LYMPHSABS 2.6 10/14/2019 0931   LYMPHSABS 1.8 02/17/2012 0450   MONOABS 0.4 05/16/2019 1208   MONOABS 0.9 02/17/2012 0450   EOSABS 0.3 10/14/2019 0931   EOSABS 0.2 02/17/2012 0450   BASOSABS 0.0 10/14/2019 0931   BASOSABS 0.1 02/17/2012 0450    Hgb A1C Lab Results  Component Value Date   HGBA1C 5.8 (H) 12/14/2022     Assessment and Plan:  Left knee pain:  Likely suprapatellar bursitis Recommend rest and ice Can use a compression sleeve to  help with support Recommend ibuprofen  600 mg 2 times daily for the next 3 to 5 days  RTC in 6 months for your annual exam Helayne Lo, NP

## 2023-07-05 NOTE — Assessment & Plan Note (Signed)
 She declines antihypertensive therapy at this time Will continue furosemide  20 mg for edema Reinforced DASH diet and exercise for weight loss C-Met today

## 2023-07-06 ENCOUNTER — Ambulatory Visit: Admitting: Certified Nurse Midwife

## 2023-07-06 ENCOUNTER — Ambulatory Visit: Payer: Self-pay | Admitting: Internal Medicine

## 2023-07-06 VITALS — BP 133/76 | HR 92 | Resp 16 | Ht 71.5 in | Wt >= 6400 oz

## 2023-07-06 DIAGNOSIS — Z3046 Encounter for surveillance of implantable subdermal contraceptive: Secondary | ICD-10-CM | POA: Diagnosis not present

## 2023-07-06 LAB — POCT URINE PREGNANCY: Preg Test, Ur: NEGATIVE

## 2023-07-06 MED ORDER — ETONOGESTREL 68 MG ~~LOC~~ IMPL: 68.0000 mg | DRUG_IMPLANT | Freq: Once | SUBCUTANEOUS | Status: AC

## 2023-07-06 NOTE — Patient Instructions (Signed)
 Nexplanon Instructions After Insertion  Keep bandage clean and dry for 24 hours  May use ice/Tylenol/Ibuprofen for soreness or pain  If you develop fever, drainage or increased warmth from incision site-contact office immediately  Etonogestrel Implant What is this medication? ETONOGESTREL (et oh noe JES trel) prevents ovulation and pregnancy. It belongs to a group of medications called contraceptives. This medication is a progestin hormone. This medicine may be used for other purposes; ask your health care provider or pharmacist if you have questions. COMMON BRAND NAME(S): Implanon, Nexplanon What should I tell my care team before I take this medication? They need to know if you have any of these conditions: Abnormal vaginal bleeding Blood clots Blood vessel disease Breast, cervical, endometrial, ovarian, liver, or uterine cancer Diabetes Gallbladder disease Heart disease or recent heart attack High blood pressure High cholesterol or triglycerides Kidney disease Liver disease Migraine headaches Seizures Stroke Tobacco use An unusual or allergic reaction to etonogestrel, other medications, foods, dyes, or preservatives Pregnant or trying to get pregnant Breastfeeding How should I use this medication? This device is inserted just under the skin on the inner side of your upper arm by your care team. Talk to your care team about the use of this medication in children. Special care may be needed. Overdosage: If you think you have taken too much of this medicine contact a poison control center or emergency room at once. NOTE: This medicine is only for you. Do not share this medicine with others. What if I miss a dose? This does not apply. What may interact with this medication? Do not take this medication with any of the following: Amprenavir Fosamprenavir This medication may also interact with the  following: Acitretin Aprepitant Armodafinil Bexarotene Bosentan Carbamazepine Certain antivirals for HIV or hepatitis Certain medications for fungal infections, such as fluconazole, ketoconazole, itraconazole, or voriconazole Cyclosporine Felbamate Griseofulvin Lamotrigine Modafinil Oxcarbazepine Phenobarbital Phenytoin Primidone Rifabutin Rifampin Rifapentine St. John's wort Topiramate This list may not describe all possible interactions. Give your health care provider a list of all the medicines, herbs, non-prescription drugs, or dietary supplements you use. Also tell them if you smoke, drink alcohol, or use illegal drugs. Some items may interact with your medicine. What should I watch for while using this medication? Visit your care team for regular checks on your progress. Using this medication does not protect you or your partner against HIV or other sexually transmitted infections (STIs). You should be able to feel the implant by pressing your fingertips over the skin where it was inserted. Contact your care team if you cannot feel the implant, and use a non-hormonal birth control method (such as condoms) until your care team confirms that the implant is in place. Contact your care team if you think that the implant may have broken or become bent while in your arm. You will receive a user card from your care team after the implant is inserted. The card is a record of the location of the implant in your upper arm and when it should be removed. Keep this card with your health records. What side effects may I notice from receiving this medication? Side effects that you should report to your care team as soon as possible: Allergic reactions--skin rash, itching, hives, swelling of the face, lips, tongue, or throat Blood clot--pain, swelling, or warmth in the leg, shortness of breath, chest pain Gallbladder problems--severe stomach pain, nausea, vomiting, fever Increase in blood  pressure Liver injury--right upper belly pain, loss of appetite, nausea,  light-colored stool, dark yellow or brown urine, yellowing skin or eyes, unusual weakness or fatigue New or worsening migraines or headaches Pain, redness, or irritation at injection site Stroke--sudden numbness or weakness of the face, arm, or leg, trouble speaking, confusion, trouble walking, loss of balance or coordination, dizziness, severe headache, change in vision Unusual vaginal discharge, itching, or odor Worsening mood, feelings of depression Side effects that usually do not require medical attention (report to your care team if they continue or are bothersome): Breast pain or tenderness Dark patches of skin on the face or other sun-exposed areas Irregular menstrual cycles or spotting Nausea Weight gain This list may not describe all possible side effects. Call your doctor for medical advice about side effects. You may report side effects to FDA at 1-800-FDA-1088. Where should I keep my medication? This medication is given in a hospital or clinic and will not be stored at home. NOTE: This sheet is a summary. It may not cover all possible information. If you have questions about this medicine, talk to your doctor, pharmacist, or health care provider.  2024 Elsevier/Gold Standard (2021-08-30 00:00:00)

## 2023-07-06 NOTE — Progress Notes (Signed)
      GYNECOLOGY OFFICE PROCEDURE NOTE  Ashley Olson is a 38 y.o. [id number] here for Nexplanon  removal and Nexplanon  insertion.  Last pap smear was on 10/14/2019 and was normal.  No other gynecologic concerns.  Nexplanon  Removal and Reinsertion Patient identified, informed consent performed, consent signed.   Patient does understand that irregular bleeding is a very common side effect of this medication.  Pregnancy test in clinic today was negative.  Appropriate time out taken. Nexplanon  site identified in left arm.  Area prepped in usual sterile fashon. One ml of 1% lidocaine  was used to anesthetize the area at the distal end of the implant. A small stab incision was made right beside the implant on the distal portion. The Nexplanon  rod was grasped using hemostats and removed without difficulty. There was minimal blood loss. There were no complications. Area was then injected with 3 ml of 1 % lidocaine . She was re-prepped with betadine, Nexplanon  removed from packaging, Device confirmed in needle, then inserted full length of needle and withdrawn per handbook instructions. Nexplanon  was able to palpated in the patient's arm; patient palpated the insert herself.  There was minimal blood loss. Patient insertion site covered with gauze and a pressure bandage to reduce any bruising. The patient tolerated the procedure well and was given post procedure instructions.  She was advised to have backup contraception for one week.      Donato Fu, CNM [facility]

## 2023-12-10 LAB — CBC
HCT: 38.9 % (ref 35.0–45.0)
Hemoglobin: 12.2 g/dL (ref 11.7–15.5)
MCH: 25.6 pg — ABNORMAL LOW (ref 27.0–33.0)
MCHC: 31.4 g/dL — ABNORMAL LOW (ref 32.0–36.0)
MCV: 81.6 fL (ref 80.0–100.0)
MPV: 10.6 fL (ref 7.5–12.5)
Platelets: 329 Thousand/uL (ref 140–400)
RBC: 4.77 Million/uL (ref 3.80–5.10)
RDW: 15 % (ref 11.0–15.0)
WBC: 7 Thousand/uL (ref 3.8–10.8)

## 2023-12-10 LAB — LIPID PANEL
Cholesterol: 130 mg/dL (ref <200)
HDL: 58 mg/dL (ref >=50)
LDL Cholesterol (Calc): 47 mg/dL
Non-HDL Cholesterol (Calc): 72 mg/dL (ref <130)
Total CHOL/HDL Ratio: 2.2 (calc) (ref <5.0)
Triglycerides: 168 mg/dL — ABNORMAL HIGH (ref <150)

## 2023-12-10 LAB — COMPREHENSIVE METABOLIC PANEL WITH GFR
AG Ratio: 1.2 (calc) (ref 1.0–2.5)
ALT: 14 U/L (ref 6–29)
AST: 14 U/L (ref 10–30)
Albumin: 3.8 g/dL (ref 3.6–5.1)
Alkaline phosphatase (APISO): 71 U/L (ref 31–125)
BUN/Creatinine Ratio: 17 (calc) (ref 6–22)
BUN: 18 mg/dL (ref 7–25)
CO2: 27 mmol/L (ref 20–32)
Calcium: 9.1 mg/dL (ref 8.6–10.2)
Chloride: 103 mmol/L (ref 98–110)
Creat: 1.07 mg/dL — ABNORMAL HIGH (ref 0.50–0.97)
Globulin: 3.2 g/dL (ref 1.9–3.7)
Glucose, Bld: 82 mg/dL (ref 65–139)
Potassium: 4.4 mmol/L (ref 3.5–5.3)
Sodium: 138 mmol/L (ref 135–146)
Total Bilirubin: 0.2 mg/dL (ref 0.2–1.2)
Total Protein: 7 g/dL (ref 6.1–8.1)
eGFR: 69 mL/min/1.73m2 (ref >=60)

## 2023-12-10 LAB — HEMOGLOBIN A1C
Hgb A1c MFr Bld: 5.9 % — ABNORMAL HIGH (ref <5.7)
Mean Plasma Glucose: 123 mg/dL
eAG (mmol/L): 6.8 mmol/L

## 2023-12-20 ENCOUNTER — Encounter: Payer: Self-pay | Admitting: Internal Medicine

## 2023-12-20 ENCOUNTER — Ambulatory Visit (INDEPENDENT_AMBULATORY_CARE_PROVIDER_SITE_OTHER): Admitting: Internal Medicine

## 2023-12-20 VITALS — BP 138/81 | Ht 71.5 in | Wt >= 6400 oz

## 2023-12-20 DIAGNOSIS — Z136 Encounter for screening for cardiovascular disorders: Secondary | ICD-10-CM | POA: Diagnosis not present

## 2023-12-20 DIAGNOSIS — R7303 Prediabetes: Secondary | ICD-10-CM | POA: Diagnosis not present

## 2023-12-20 DIAGNOSIS — Z0001 Encounter for general adult medical examination with abnormal findings: Secondary | ICD-10-CM | POA: Diagnosis not present

## 2023-12-20 DIAGNOSIS — K219 Gastro-esophageal reflux disease without esophagitis: Secondary | ICD-10-CM | POA: Diagnosis not present

## 2023-12-20 DIAGNOSIS — B369 Superficial mycosis, unspecified: Secondary | ICD-10-CM | POA: Diagnosis not present

## 2023-12-20 MED ORDER — SELENIUM SULFIDE 2.5 % EX LOTN: 1.0000 | TOPICAL_LOTION | Freq: Every day | CUTANEOUS | 5 refills | Status: AC | PRN

## 2023-12-20 MED ORDER — OMEPRAZOLE 20 MG PO CPDR: 20.0000 mg | DELAYED_RELEASE_CAPSULE | Freq: Every day | ORAL | 1 refills | Status: AC

## 2023-12-20 NOTE — Progress Notes (Signed)
 Subjective:    Patient ID: Ashley Olson, female    DOB: 1986-02-02, 38 y.o.   MRN: [id number]  HPI  Patient presents to clinic today for her annual exam.  Flu: never Tetanus: 11/2016 COVID: never Pap smear: 10/2019 Dentist: biannually  Diet: She does eat meat. She consumes more fruits than veggies. She does eat some fried foods. She drinks mostly water. Exercise: None  Review of Systems     Past Medical History:  Diagnosis Date   Anemia    Morbid obesity (HCC)    Sciatic pain     Current Outpatient Medications  Medication Sig Dispense Refill   Etonogestrel  (NEXPLANON  [place]) Inject into the skin.     furosemide  (LASIX ) 20 MG tablet Take 1 tablet (20 mg total) by mouth daily. Take 1 tab PO bid x 3 days then daily in am thereafter 90 tablet 0   Current Facility-Administered Medications  Medication Dose Route Frequency Provider Last Rate Last Admin   etonogestrel  (NEXPLANON ) implant 68 mg  68 mg Subdermal Once [place], [place], CNM        Allergies  Allergen Reactions   Amoxicillin Swelling    Family History  Problem Relation Age of Onset   Hypertension Mother    Diabetes Mother    Hypertension Maternal Grandmother    Hypertension Maternal Grandfather    Heart disease Maternal Grandfather    Diabetes Maternal Grandfather    Autism Daughter    Mental illness Neg Hx     Social History   Socioeconomic History   Marital status: Single    Spouse name: Not on file   Number of children: Not on file   Years of education: Not on file   Highest education level: Not on file  Occupational History   Not on file  Tobacco Use   Smoking status: Former    Current packs/day: 0.00    Types: Cigarettes    Quit date: 06/12/2017    Years since quitting: 6.5   Smokeless tobacco: Never   Tobacco comments:    a pack every 2 weeks  Vaping Use   Vaping status: Former  Substance and Sexual Activity   Alcohol use: Yes    Comment: Occasionally   Drug use: Not  Currently   Sexual activity: Yes    Birth control/protection: Condom  Other Topics Concern   Not on file  Social History Narrative   Not on file   Social Drivers of Health   Financial Resource Strain: Not on file  Food Insecurity: Not on file  Transportation Needs: Not on file  Physical Activity: Not on file  Stress: Not on file  Social Connections: Not on file  Intimate Partner Violence: Not on file     Constitutional: Denies fever, malaise, fatigue, headaches or abrupt weight changes.  HEENT: Denies eye pain, eye redness, ear pain, ringing in the ears, wax buildup, runny nose, nasal congestion, bloody nose, or sore throat. Respiratory: Denies difficulty breathing, shortness of breath, cough or sputum production.   Cardiovascular: Denies chest pain, chest tightness, palpitations or swelling in the hands or feet.  Gastrointestinal: Pt reports constipation, reflux. Denies abdominal pain, bloating, diarrhea or blood in the stool.  GU: Denies urgency, frequency, pain with urination, burning sensation, blood in urine, or discharge. Musculoskeletal: Patient reports chronic joint pain.  Denies decrease in range of motion, difficulty with gait, muscle pain or joint swelling.  Skin: Pt reports rash of back. Denies lesions or ulcercations.  Neurological: Patient reports  insomnia.  Denies dizziness, difficulty with memory, difficulty with speech or problems with balance and coordination.  Psych: Patient has a history of anxiety and depression.  Denies SI/HI.  No other specific complaints in a complete review of systems (except as listed in HPI above).  Objective:   Physical Exam  BP 138/81 (BP Location: Right Wrist, Patient Position: Sitting, Cuff Size: Large)   Ht 5' 11.5 (1.816 m)   Wt (!) 454 lb (205.9 kg)   LMP  (LMP Unknown)   BMI 62.44 kg/m    Wt Readings from Last 3 Encounters:  07/06/23 (!) 455 lb 8 oz (206.6 kg)  07/05/23 (!) 457 lb 5 oz (207.4 kg)  06/15/23 (!) 455 lb  8 oz (206.6 kg)    General: Appears her stated age, obese, in NAD. Skin: Warm, dry and intact. Scattered round raised scaly lesions noted of back. HEENT: Head: normal shape and size; Eyes: sclera white, no icterus, conjunctiva pink, PERRLA and EOMs intact;  Neck:  Neck supple, trachea midline. No masses, lumps or thyromegaly present.  Cardiovascular: Normal rate and rhythm. S1,S2 noted.  No murmur, rubs or gallops noted. No JVD or BLE edema. Pulmonary/Chest: Normal effort and positive vesicular breath sounds. No respiratory distress. No wheezes, rales or ronchi noted.  Abdomen: Soft and nontender. Normal bowel sounds. Musculoskeletal: Strength 5/5 BUE/BLE. No difficulty with gait.  Neurological: Alert and oriented. Cranial nerves II-XII grossly intact. Coordination normal.  Psychiatric: Mood and affect normal. Behavior is normal. Judgment and thought content normal.    BMET    Component Value Date/Time   NA 138 07/05/2023 1507   NA 137 10/14/2019 0931   NA 140 02/16/2012 0449   K 4.4 07/05/2023 1507   K 4.0 02/16/2012 0449   CL 103 07/05/2023 1507   CL 109 (H) 02/16/2012 0449   CO2 27 07/05/2023 1507   CO2 24 02/16/2012 0449   GLUCOSE 82 07/05/2023 1507   GLUCOSE 99 02/16/2012 0449   BUN 18 07/05/2023 1507   BUN 10 10/14/2019 0931   BUN 13 02/16/2012 0449   CREATININE 1.07 (H) 07/05/2023 1507   CALCIUM 9.1 07/05/2023 1507   CALCIUM 8.3 (L) 02/16/2012 0449   GFRNONAA 107 10/14/2019 0931   GFRNONAA >60 02/21/2012 0608   GFRAA 123 10/14/2019 0931   GFRAA >60 02/21/2012 0608    Lipid Panel     Component Value Date/Time   CHOL 130 07/05/2023 1507   CHOL 130 10/14/2019 0931   TRIG 168 (H) 07/05/2023 1507   HDL 58 07/05/2023 1507   HDL 60 10/14/2019 0931   CHOLHDL 2.2 07/05/2023 1507   LDLCALC 47 07/05/2023 1507    CBC    Component Value Date/Time   WBC 7.0 07/05/2023 1507   RBC 4.77 07/05/2023 1507   HGB 12.2 07/05/2023 1507   HGB 12.7 10/14/2019 0931   HCT 38.9  07/05/2023 1507   HCT 38.8 10/14/2019 0931   PLT 329 07/05/2023 1507   PLT 309 10/14/2019 0931   MCV 81.6 07/05/2023 1507   MCV 83 10/14/2019 0931   MCV 77 (L) 02/17/2012 0450   MCH 25.6 (L) 07/05/2023 1507   MCHC 31.4 (L) 07/05/2023 1507   RDW 15.0 07/05/2023 1507   RDW 14.6 10/14/2019 0931   RDW 16.9 (H) 02/17/2012 0450   LYMPHSABS 2.6 10/14/2019 0931   LYMPHSABS 1.8 02/17/2012 0450   MONOABS 0.4 05/16/2019 1208   MONOABS 0.9 02/17/2012 0450   EOSABS 0.3 10/14/2019 0931   EOSABS 0.2  02/17/2012 0450   BASOSABS 0.0 10/14/2019 0931   BASOSABS 0.1 02/17/2012 0450    Hgb A1C Lab Results  Component Value Date   HGBA1C 5.9 (H) 07/05/2023           Assessment & Plan:   Preventative health maintenance:  Flu shot declined Tetanus UTD Encouraged her to get her COVID booster Pap smear UTD Encouraged her to consume a balanced diet and exercise regimen Advised her to see an eye doctor and dentist annually We will check CBC, c-Met, lipid, A1c today  Funga rash of back:  RX for selenium sulfide lotion 2.5% daily x 4 weeks  RTC in 6 months, follow-up chronic conditions Angeline Laura, NP

## 2023-12-20 NOTE — Patient Instructions (Signed)

## 2023-12-20 NOTE — Assessment & Plan Note (Signed)
 Encouraged diet and exercise for weight loss ?

## 2023-12-20 NOTE — Assessment & Plan Note (Signed)
 Complicated by morbid obesity Identify and avoid foods that trigger your reflux Avoid eating and laying down Will trial omeprazole 20 mg daily

## 2023-12-21 ENCOUNTER — Ambulatory Visit: Payer: Self-pay | Admitting: Internal Medicine

## 2023-12-21 LAB — COMPREHENSIVE METABOLIC PANEL WITH GFR
AG Ratio: 1.2 (calc) (ref 1.0–2.5)
ALT: 10 U/L (ref 6–29)
AST: 11 U/L (ref 10–30)
Albumin: 3.7 g/dL (ref 3.6–5.1)
Alkaline phosphatase (APISO): 76 U/L (ref 31–125)
BUN: 10 mg/dL (ref 7–25)
CO2: 23 mmol/L (ref 20–32)
Calcium: 9 mg/dL (ref 8.6–10.2)
Chloride: 106 mmol/L (ref 98–110)
Creat: 0.87 mg/dL (ref 0.50–0.97)
Globulin: 3.2 g/dL (ref 1.9–3.7)
Glucose, Bld: 90 mg/dL (ref 65–99)
Potassium: 4.5 mmol/L (ref 3.5–5.3)
Sodium: 138 mmol/L (ref 135–146)
Total Bilirubin: 0.3 mg/dL (ref 0.2–1.2)
Total Protein: 6.9 g/dL (ref 6.1–8.1)
eGFR: 88 mL/min/1.73m2 (ref >=60)

## 2023-12-21 LAB — HEMOGLOBIN A1C
Hgb A1c MFr Bld: 5.8 % — ABNORMAL HIGH (ref <5.7)
Mean Plasma Glucose: 120 mg/dL
eAG (mmol/L): 6.6 mmol/L

## 2023-12-21 LAB — LIPID PANEL
Cholesterol: 112 mg/dL (ref <200)
HDL: 47 mg/dL — ABNORMAL LOW (ref >=50)
LDL Cholesterol (Calc): 44 mg/dL
Non-HDL Cholesterol (Calc): 65 mg/dL (ref <130)
Total CHOL/HDL Ratio: 2.4 (calc) (ref <5.0)
Triglycerides: 125 mg/dL (ref <150)

## 2023-12-21 LAB — CBC
HCT: 38.6 % (ref 35.0–45.0)
Hemoglobin: 12.2 g/dL (ref 11.7–15.5)
MCH: 25.2 pg — ABNORMAL LOW (ref 27.0–33.0)
MCHC: 31.6 g/dL — ABNORMAL LOW (ref 32.0–36.0)
MCV: 79.8 fL — ABNORMAL LOW (ref 80.0–100.0)
MPV: 10.7 fL (ref 7.5–12.5)
Platelets: 314 Thousand/uL (ref 140–400)
RBC: 4.84 Million/uL (ref 3.80–5.10)
RDW: 14.9 % (ref 11.0–15.0)
WBC: 6.8 Thousand/uL (ref 3.8–10.8)

## 2024-06-20 ENCOUNTER — Ambulatory Visit: Admitting: Internal Medicine
